# Patient Record
Sex: Male | Born: 1965 | Race: White | Hispanic: No | Marital: Married | Smoking: Never smoker
Health system: Southern US, Community
[De-identification: ages and names within clinical notes are randomized; demographics above are authoritative.]

## PROBLEM LIST (undated history)

## (undated) DIAGNOSIS — I10 Essential (primary) hypertension: Secondary | ICD-10-CM

## (undated) DIAGNOSIS — M62838 Other muscle spasm: Secondary | ICD-10-CM

## (undated) DIAGNOSIS — E785 Hyperlipidemia, unspecified: Secondary | ICD-10-CM

## (undated) DIAGNOSIS — K5790 Diverticulosis of intestine, part unspecified, without perforation or abscess without bleeding: Secondary | ICD-10-CM

## (undated) DIAGNOSIS — Z8669 Personal history of other diseases of the nervous system and sense organs: Secondary | ICD-10-CM

## (undated) DIAGNOSIS — F419 Anxiety disorder, unspecified: Secondary | ICD-10-CM

## (undated) DIAGNOSIS — Z9889 Other specified postprocedural states: Secondary | ICD-10-CM

## (undated) HISTORY — PX: COLONOSCOPY: SHX174

## (undated) HISTORY — PX: WISDOM TOOTH EXTRACTION: SHX21

## (undated) HISTORY — PX: HERNIA REPAIR: SHX51

---

## 2004-12-14 ENCOUNTER — Encounter: Admission: RE | Admit: 2004-12-14 | Discharge: 2004-12-14 | Payer: Self-pay | Admitting: Internal Medicine

## 2004-12-21 ENCOUNTER — Ambulatory Visit: Payer: Self-pay | Admitting: Gastroenterology

## 2005-01-17 ENCOUNTER — Ambulatory Visit: Payer: Self-pay | Admitting: Gastroenterology

## 2005-01-26 ENCOUNTER — Ambulatory Visit: Payer: Self-pay | Admitting: Gastroenterology

## 2006-06-19 ENCOUNTER — Encounter: Admission: RE | Admit: 2006-06-19 | Discharge: 2006-06-19 | Payer: Self-pay | Admitting: Internal Medicine

## 2010-03-06 ENCOUNTER — Encounter: Payer: Self-pay | Admitting: Internal Medicine

## 2010-11-23 ENCOUNTER — Other Ambulatory Visit (HOSPITAL_COMMUNITY): Payer: Self-pay | Admitting: Internal Medicine

## 2010-11-23 DIAGNOSIS — R569 Unspecified convulsions: Secondary | ICD-10-CM

## 2010-12-01 ENCOUNTER — Inpatient Hospital Stay (HOSPITAL_COMMUNITY): Admission: RE | Admit: 2010-12-01 | Payer: Self-pay | Source: Ambulatory Visit

## 2010-12-01 ENCOUNTER — Ambulatory Visit (HOSPITAL_COMMUNITY): Payer: Self-pay

## 2013-09-09 ENCOUNTER — Encounter: Payer: Self-pay | Admitting: Gastroenterology

## 2014-06-05 ENCOUNTER — Encounter: Payer: Self-pay | Admitting: Nurse Practitioner

## 2014-06-17 ENCOUNTER — Ambulatory Visit: Payer: Self-pay | Admitting: Nurse Practitioner

## 2015-02-19 ENCOUNTER — Encounter: Payer: Self-pay | Admitting: Gastroenterology

## 2015-06-15 ENCOUNTER — Ambulatory Visit (INDEPENDENT_AMBULATORY_CARE_PROVIDER_SITE_OTHER): Payer: Managed Care, Other (non HMO)

## 2015-06-15 ENCOUNTER — Encounter (HOSPITAL_COMMUNITY): Payer: Self-pay | Admitting: *Deleted

## 2015-06-15 ENCOUNTER — Ambulatory Visit (HOSPITAL_COMMUNITY)
Admission: EM | Admit: 2015-06-15 | Discharge: 2015-06-15 | Disposition: A | Discharge status: Home / Self Care | Payer: Managed Care, Other (non HMO) | Attending: Family Medicine | Admitting: Family Medicine

## 2015-06-15 DIAGNOSIS — S52501A Unspecified fracture of the lower end of right radius, initial encounter for closed fracture: Secondary | ICD-10-CM

## 2015-06-15 HISTORY — DX: Essential (primary) hypertension: I10

## 2015-06-15 MED ORDER — HYDROCODONE-ACETAMINOPHEN 7.5-325 MG PO TABS: 1.0000 | ORAL_TABLET | Freq: Four times a day (QID) | ORAL | Status: DC | PRN

## 2015-06-15 NOTE — Discharge Instructions (Signed)
Call in am to see orthopedist on tues for discussion about wrist injury

## 2015-06-15 NOTE — ED Provider Notes (Signed)
CSN: [id number]     Arrival date & time 06/15/15  1831 History   First MD Initiated Contact with Patient 06/15/15 1936     Chief Complaint  Patient presents with  . Wrist Injury   (Consider location/radiation/quality/duration/timing/severity/associated sxs/prior Treatment) Patient is a 50 y.o. male presenting with wrist injury. The history is provided by the patient.  Wrist Injury Location:  Wrist Time since incident:  2 hours Injury: yes   Mechanism of injury: fall   Mechanism of injury comment:  Fell from ladder trying to clean gutters Fall:    Fall occurred:  From a ladder   Impact surface:  Grass   Entrapped after fall: no   Wrist location:  R wrist Pain details:    Radiates to:  Does not radiate   Severity:  Moderate   Onset quality:  Sudden   Progression:  Unchanged Chronicity:  New Dislocation: no   Prior injury to area:  No Relieved by:  None tried Worsened by:  Nothing tried Ineffective treatments:  None tried Associated symptoms: decreased range of motion and swelling   Associated symptoms: no numbness     Past Medical History  Diagnosis Date  . Hypertension    History reviewed. No pertinent past surgical history. History reviewed. No pertinent family history. Social History  Substance Use Topics  . Smoking status: Never Smoker   . Smokeless tobacco: None  . Alcohol Use: No    Review of Systems  Constitutional: Negative.   Musculoskeletal: Positive for joint swelling.  Skin: Negative.   All other systems reviewed and are negative.   Allergies  Review of patient's allergies indicates no known allergies.  Home Medications   Prior to Admission medications   Medication Sig Start Date End Date Taking? Authorizing Provider  HYDROcodone-acetaminophen (NORCO) 7.5-325 MG tablet Take 1 tablet by mouth every 6 (six) hours as needed for moderate pain. 06/15/15   Linna HoffJames D Sammantha Mehlhaff, MD   Meds Ordered and Administered this Visit  Medications - No data to  display  BP 137/94 mmHg  Pulse 84  Temp(Src) 98.9 F (37.2 C) (Oral)  Resp 16  SpO2 97% No data found.   Physical Exam  Constitutional: He is oriented to person, place, and time. He appears well-developed and well-nourished. No distress.  Musculoskeletal: He exhibits tenderness.       Right wrist: He exhibits decreased range of motion, tenderness, bony tenderness, swelling and deformity. He exhibits no laceration.  Neurological: He is alert and oriented to person, place, and time.  Skin: Skin is warm and dry.  Nursing note and vitals reviewed.   ED Course  Procedures (including critical care time)  Labs Review Labs Reviewed - No data to display  Imaging Review Dg Wrist Complete Right  06/15/2015  CLINICAL DATA:  Right wrist pain after falling from a ladder at the top of a house. EXAM: RIGHT WRIST - COMPLETE 3+ VIEW COMPARISON:  None. FINDINGS: Comminuted fracture of the distal radial metaphysis and epiphysis, extending into the radiocarpal joint. Mild ulnar displacement of an ulnar fragment. Mild dorsal angulation of the distal fragments. There is also a fracture of the base of the ulnar styloid with mild radial displacement of the distal fragment. There is also an essentially nondisplaced dorsal carpal fracture with overlying soft tissue swelling. IMPRESSION: 1. Comminuted distal radial metaphysis and epiphysis fracture with extension into the radiocarpal joint. 2. Ulnar styloid fracture. 3. Dorsal carpal fracture. Electronically Signed   By: Zada FindersSteven  Reid M.D.  On: 06/15/2015 19:51   X-rays reviewed and report per radiologist.    Visual Acuity Review  Right Eye Distance:   Left Eye Distance:   Bilateral Distance:    Right Eye Near:   Left Eye Near:    Bilateral Near:         MDM   1. Distal radius fracture, right, closed, initial encounter     Discussed with dr Mina Marble, plans as suggested   Linna Hoff, MD 06/15/15 2028

## 2015-06-15 NOTE — ED Notes (Signed)
Short   Arm  splint applied  By  Milon Dikesrtho  Tech  roy

## 2015-06-15 NOTE — ED Notes (Signed)
Pt reports   He  Fell  Off  A  Ladder     And  Injured    His    r  Wrist  He  Has  Pain  And  Swelling  To  The  Affected  Wrist         He      Has  A  Contusion to  r  Side  Of  Head

## 2015-06-15 NOTE — Progress Notes (Signed)
Orthopedic Tech Progress Note Patient Details:  Lynnell DikeBryan S Campoli 11/10/1965 [id number] Applied fiberglass sugar tong splint to RUE.  Pulses, sensation, motion intact before and after splinting.  Capillary refill less than 2 seconds before and after splinting.  Placed splinted RUE in arm sling. Ortho Devices Type of Ortho Device: Sugartong splint, Arm sling Ortho Device/Splint Location: RUE Ortho Device/Splint Interventions: Application   Lesle ChrisGilliland, Hershel Corkery L 06/15/2015, 10:01 PM

## 2015-06-18 ENCOUNTER — Ambulatory Visit
Admission: RE | Admit: 2015-06-18 | Discharge: 2015-06-18 | Disposition: A | Discharge status: Home / Self Care | Payer: Managed Care, Other (non HMO) | Source: Ambulatory Visit | Attending: Orthopedic Surgery | Admitting: Orthopedic Surgery

## 2015-06-18 ENCOUNTER — Other Ambulatory Visit: Payer: Self-pay | Admitting: Orthopedic Surgery

## 2015-06-18 DIAGNOSIS — S52601A Unspecified fracture of lower end of right ulna, initial encounter for closed fracture: Principal | ICD-10-CM

## 2015-06-18 DIAGNOSIS — S52501A Unspecified fracture of the lower end of right radius, initial encounter for closed fracture: Secondary | ICD-10-CM

## 2015-06-19 ENCOUNTER — Encounter (HOSPITAL_COMMUNITY): Payer: Self-pay | Admitting: *Deleted

## 2015-06-20 ENCOUNTER — Encounter (HOSPITAL_COMMUNITY): Payer: Self-pay | Admitting: *Deleted

## 2015-06-20 ENCOUNTER — Ambulatory Visit (HOSPITAL_COMMUNITY): Payer: Managed Care, Other (non HMO) | Admitting: Anesthesiology

## 2015-06-20 ENCOUNTER — Observation Stay (HOSPITAL_COMMUNITY)
Admission: RE | Admit: 2015-06-20 | Discharge: 2015-06-21 | Disposition: A | Discharge status: Home / Self Care | Payer: Managed Care, Other (non HMO) | Source: Ambulatory Visit | Attending: Orthopedic Surgery | Admitting: Orthopedic Surgery

## 2015-06-20 ENCOUNTER — Encounter (HOSPITAL_COMMUNITY)
Admission: RE | Disposition: A | Discharge status: Home / Self Care | Payer: Self-pay | Source: Ambulatory Visit | Attending: Orthopedic Surgery

## 2015-06-20 DIAGNOSIS — Z79899 Other long term (current) drug therapy: Secondary | ICD-10-CM | POA: Insufficient documentation

## 2015-06-20 DIAGNOSIS — W11XXXA Fall on and from ladder, initial encounter: Secondary | ICD-10-CM | POA: Diagnosis not present

## 2015-06-20 DIAGNOSIS — S52501A Unspecified fracture of the lower end of right radius, initial encounter for closed fracture: Secondary | ICD-10-CM | POA: Diagnosis present

## 2015-06-20 DIAGNOSIS — S52611A Displaced fracture of right ulna styloid process, initial encounter for closed fracture: Secondary | ICD-10-CM | POA: Diagnosis not present

## 2015-06-20 DIAGNOSIS — E785 Hyperlipidemia, unspecified: Secondary | ICD-10-CM | POA: Diagnosis not present

## 2015-06-20 DIAGNOSIS — W132XXA Fall from, out of or through roof, initial encounter: Secondary | ICD-10-CM | POA: Diagnosis not present

## 2015-06-20 DIAGNOSIS — R51 Headache: Secondary | ICD-10-CM | POA: Insufficient documentation

## 2015-06-20 DIAGNOSIS — I10 Essential (primary) hypertension: Secondary | ICD-10-CM | POA: Diagnosis not present

## 2015-06-20 DIAGNOSIS — F419 Anxiety disorder, unspecified: Secondary | ICD-10-CM | POA: Diagnosis not present

## 2015-06-20 DIAGNOSIS — S62116A Nondisplaced fracture of triquetrum [cuneiform] bone, unspecified wrist, initial encounter for closed fracture: Principal | ICD-10-CM | POA: Insufficient documentation

## 2015-06-20 DIAGNOSIS — E7800 Pure hypercholesterolemia, unspecified: Secondary | ICD-10-CM | POA: Insufficient documentation

## 2015-06-20 HISTORY — DX: Diverticulosis of intestine, part unspecified, without perforation or abscess without bleeding: K57.90

## 2015-06-20 HISTORY — DX: Other muscle spasm: M62.838

## 2015-06-20 HISTORY — DX: Other specified postprocedural states: Z98.890

## 2015-06-20 HISTORY — DX: Hyperlipidemia, unspecified: E78.5

## 2015-06-20 HISTORY — DX: Personal history of other diseases of the nervous system and sense organs: Z86.69

## 2015-06-20 HISTORY — PX: OPEN REDUCTION INTERNAL FIXATION (ORIF) DISTAL RADIAL FRACTURE: SHX5989

## 2015-06-20 HISTORY — DX: Anxiety disorder, unspecified: F41.9

## 2015-06-20 LAB — BASIC METABOLIC PANEL
ANION GAP: 11 (ref 5–15)
BUN: 16 mg/dL (ref 6–20)
CO2: 22 mmol/L (ref 22–32)
Calcium: 9 mg/dL (ref 8.9–10.3)
Chloride: 104 mmol/L (ref 101–111)
Creatinine, Ser: 0.94 mg/dL (ref 0.61–1.24)
Glucose, Bld: 113 mg/dL — ABNORMAL HIGH (ref 65–99)
POTASSIUM: 4.1 mmol/L (ref 3.5–5.1)
SODIUM: 137 mmol/L (ref 135–145)

## 2015-06-20 LAB — CBC
HCT: 39.9 % (ref 39.0–52.0)
Hemoglobin: 13.3 g/dL (ref 13.0–17.0)
MCH: 29.9 pg (ref 26.0–34.0)
MCHC: 33.3 g/dL (ref 30.0–36.0)
MCV: 89.7 fL (ref 78.0–100.0)
PLATELETS: 307 10*3/uL (ref 150–400)
RBC: 4.45 MIL/uL (ref 4.22–5.81)
RDW: 13.3 % (ref 11.5–15.5)
WBC: 6.1 10*3/uL (ref 4.0–10.5)

## 2015-06-20 SURGERY — OPEN REDUCTION INTERNAL FIXATION (ORIF) DISTAL RADIUS FRACTURE
Anesthesia: Monitor Anesthesia Care | Site: Wrist | Laterality: Right

## 2015-06-20 MED ORDER — MIDAZOLAM HCL 2 MG/2ML IJ SOLN
INTRAMUSCULAR | Status: AC
  Filled 2015-06-20: qty 2

## 2015-06-20 MED ORDER — LACTATED RINGERS IV SOLN
INTRAVENOUS | Status: DC | PRN
  Administered 2015-06-20: 12:00:00 via INTRAVENOUS

## 2015-06-20 MED ORDER — BUPIVACAINE-EPINEPHRINE (PF) 0.5% -1:200000 IJ SOLN
INTRAMUSCULAR | Status: DC | PRN
  Administered 2015-06-20: 30 mL via PERINEURAL

## 2015-06-20 MED ORDER — DEXMEDETOMIDINE HCL IN NACL 200 MCG/50ML IV SOLN
INTRAVENOUS | Status: AC
  Filled 2015-06-20: qty 50

## 2015-06-20 MED ORDER — KETAMINE HCL 10 MG/ML IJ SOLN
INTRAMUSCULAR | Status: DC | PRN
  Administered 2015-06-20 (×4): 10 mg via INTRAVENOUS

## 2015-06-20 MED ORDER — PROPOFOL 500 MG/50ML IV EMUL
INTRAVENOUS | Status: DC | PRN
  Administered 2015-06-20: 50 ug/kg/min via INTRAVENOUS

## 2015-06-20 MED ORDER — ONDANSETRON HCL 4 MG/2ML IJ SOLN
4.0000 mg | Freq: Four times a day (QID) | INTRAMUSCULAR | Status: DC | PRN
  Filled 2015-06-20: qty 2

## 2015-06-20 MED ORDER — CEFAZOLIN SODIUM-DEXTROSE 2-4 GM/100ML-% IV SOLN
INTRAVENOUS | Status: AC
  Administered 2015-06-20: 2 g via INTRAVENOUS
  Filled 2015-06-20: qty 100

## 2015-06-20 MED ORDER — ATORVASTATIN CALCIUM 10 MG PO TABS
10.0000 mg | ORAL_TABLET | Freq: Every day | ORAL | Status: DC
  Administered 2015-06-21: 10 mg via ORAL
  Filled 2015-06-20: qty 1

## 2015-06-20 MED ORDER — SUMATRIPTAN SUCCINATE 50 MG PO TABS
50.0000 mg | ORAL_TABLET | ORAL | Status: DC | PRN
  Filled 2015-06-20: qty 1

## 2015-06-20 MED ORDER — LIDOCAINE 2% (20 MG/ML) 5 ML SYRINGE
INTRAMUSCULAR | Status: AC
  Filled 2015-06-20: qty 5

## 2015-06-20 MED ORDER — PROPOFOL 10 MG/ML IV BOLUS
INTRAVENOUS | Status: AC
Start: 2015-06-20 — End: 2015-06-20
  Filled 2015-06-20: qty 40

## 2015-06-20 MED ORDER — METHOCARBAMOL 1000 MG/10ML IJ SOLN
500.0000 mg | Freq: Four times a day (QID) | INTRAMUSCULAR | Status: DC | PRN
  Filled 2015-06-20: qty 5

## 2015-06-20 MED ORDER — MORPHINE SULFATE (PF) 2 MG/ML IV SOLN
1.0000 mg | INTRAVENOUS | Status: DC | PRN
  Administered 2015-06-20 – 2015-06-21 (×6): 1 mg via INTRAVENOUS
  Filled 2015-06-20 (×6): qty 1

## 2015-06-20 MED ORDER — PROMETHAZINE HCL 12.5 MG RE SUPP
12.5000 mg | Freq: Four times a day (QID) | RECTAL | Status: DC | PRN
  Filled 2015-06-20: qty 1

## 2015-06-20 MED ORDER — CEFAZOLIN SODIUM 1-5 GM-% IV SOLN
1.0000 g | Freq: Three times a day (TID) | INTRAVENOUS | Status: DC
  Administered 2015-06-20 – 2015-06-21 (×2): 1 g via INTRAVENOUS
  Filled 2015-06-20 (×4): qty 50

## 2015-06-20 MED ORDER — SENNA 8.6 MG PO TABS
1.0000 | ORAL_TABLET | Freq: Two times a day (BID) | ORAL | Status: DC
Start: 2015-06-20 — End: 2015-06-21
  Administered 2015-06-20 – 2015-06-21 (×2): 8.6 mg via ORAL
  Filled 2015-06-20 (×2): qty 1

## 2015-06-20 MED ORDER — LIDOCAINE HCL (CARDIAC) 20 MG/ML IV SOLN
INTRAVENOUS | Status: DC | PRN
  Administered 2015-06-20: 20 mg via INTRAVENOUS

## 2015-06-20 MED ORDER — SODIUM CHLORIDE 0.9 % IJ SOLN
INTRAMUSCULAR | Status: AC
  Filled 2015-06-20: qty 10

## 2015-06-20 MED ORDER — CHLORHEXIDINE GLUCONATE 4 % EX LIQD: 60.0000 mL | Freq: Once | CUTANEOUS | Status: DC

## 2015-06-20 MED ORDER — CEFAZOLIN SODIUM-DEXTROSE 2-4 GM/100ML-% IV SOLN: 2.0000 g | INTRAVENOUS | Status: DC

## 2015-06-20 MED ORDER — KETAMINE HCL 100 MG/ML IJ SOLN
INTRAMUSCULAR | Status: AC
  Filled 2015-06-20: qty 1

## 2015-06-20 MED ORDER — OXYCODONE-ACETAMINOPHEN 5-325 MG PO TABS
1.0000 | ORAL_TABLET | ORAL | Status: DC | PRN
  Administered 2015-06-20 – 2015-06-21 (×4): 2 via ORAL
  Filled 2015-06-20 (×5): qty 2

## 2015-06-20 MED ORDER — METHOCARBAMOL 500 MG PO TABS
500.0000 mg | ORAL_TABLET | Freq: Four times a day (QID) | ORAL | Status: DC | PRN
  Administered 2015-06-21 (×2): 500 mg via ORAL
  Filled 2015-06-20 (×2): qty 1

## 2015-06-20 MED ORDER — MIDAZOLAM HCL 5 MG/5ML IJ SOLN
INTRAMUSCULAR | Status: DC | PRN
  Administered 2015-06-20: 2 mg via INTRAVENOUS
  Administered 2015-06-20 (×2): 1 mg via INTRAVENOUS

## 2015-06-20 MED ORDER — LACTATED RINGERS IV SOLN
INTRAVENOUS | Status: DC
  Administered 2015-06-20: 20:00:00 via INTRAVENOUS

## 2015-06-20 MED ORDER — CEFAZOLIN SODIUM 1-5 GM-% IV SOLN
1.0000 g | INTRAVENOUS | Status: AC
  Administered 2015-06-20: 1 g via INTRAVENOUS

## 2015-06-20 MED ORDER — PROPOFOL 10 MG/ML IV BOLUS
INTRAVENOUS | Status: DC | PRN
  Administered 2015-06-20: 20 mg via INTRAVENOUS

## 2015-06-20 MED ORDER — ATENOLOL 50 MG PO TABS
50.0000 mg | ORAL_TABLET | Freq: Every day | ORAL | Status: DC
  Administered 2015-06-21: 50 mg via ORAL
  Filled 2015-06-20: qty 1

## 2015-06-20 MED ORDER — CEFAZOLIN SODIUM 1-5 GM-% IV SOLN
INTRAVENOUS | Status: AC
  Administered 2015-06-20: 1 g via INTRAVENOUS
  Filled 2015-06-20: qty 50

## 2015-06-20 MED ORDER — FENTANYL CITRATE (PF) 250 MCG/5ML IJ SOLN
INTRAMUSCULAR | Status: AC
  Filled 2015-06-20: qty 5

## 2015-06-20 MED ORDER — 0.9 % SODIUM CHLORIDE (POUR BTL) OPTIME
TOPICAL | Status: DC | PRN
  Administered 2015-06-20: 1000 mL

## 2015-06-20 MED ORDER — ALPRAZOLAM 0.5 MG PO TABS
0.5000 mg | ORAL_TABLET | Freq: Four times a day (QID) | ORAL | Status: DC | PRN
  Administered 2015-06-20 – 2015-06-21 (×2): 0.5 mg via ORAL
  Filled 2015-06-20 (×2): qty 1

## 2015-06-20 MED ORDER — PANTOPRAZOLE SODIUM 40 MG PO TBEC: 40.0000 mg | DELAYED_RELEASE_TABLET | Freq: Two times a day (BID) | ORAL | Status: DC | PRN

## 2015-06-20 MED ORDER — LACTATED RINGERS IV SOLN
INTRAVENOUS | Status: DC
  Administered 2015-06-20: 10:00:00 via INTRAVENOUS

## 2015-06-20 MED ORDER — ONDANSETRON HCL 4 MG PO TABS: 4.0000 mg | ORAL_TABLET | Freq: Four times a day (QID) | ORAL | Status: DC | PRN

## 2015-06-20 MED ORDER — FENTANYL CITRATE (PF) 100 MCG/2ML IJ SOLN
INTRAMUSCULAR | Status: AC
  Administered 2015-06-20: 100 ug via INTRAVENOUS
  Filled 2015-06-20: qty 2

## 2015-06-20 SURGICAL SUPPLY — 62 items
BANDAGE ACE 3X5.8 VEL STRL LF (GAUZE/BANDAGES/DRESSINGS) ×1 IMPLANT
BANDAGE ELASTIC 3 VELCRO ST LF (GAUZE/BANDAGES/DRESSINGS) ×2 IMPLANT
BANDAGE ELASTIC 4 VELCRO ST LF (GAUZE/BANDAGES/DRESSINGS) ×1 IMPLANT
BIT DRILL 2.2 SS TIBIAL (BIT) ×1 IMPLANT
BLADE SURG ROTATE 9660 (MISCELLANEOUS) IMPLANT
BNDG CMPR 9X4 STRL LF SNTH (GAUZE/BANDAGES/DRESSINGS)
BNDG ESMARK 4X9 LF (GAUZE/BANDAGES/DRESSINGS) ×1 IMPLANT
BNDG GAUZE ELAST 4 BULKY (GAUZE/BANDAGES/DRESSINGS) ×4 IMPLANT
CORDS BIPOLAR (ELECTRODE) ×2 IMPLANT
COVER SURGICAL LIGHT HANDLE (MISCELLANEOUS) ×2 IMPLANT
CUFF TOURNIQUET SINGLE 18IN (TOURNIQUET CUFF) ×2 IMPLANT
CUFF TOURNIQUET SINGLE 24IN (TOURNIQUET CUFF) IMPLANT
DECANTER SPIKE VIAL GLASS SM (MISCELLANEOUS) IMPLANT
DRAIN TLS ROUND 10FR (DRAIN) IMPLANT
DRAPE OEC MINIVIEW 54X84 (DRAPES) ×1 IMPLANT
DRAPE U-SHAPE 47X51 STRL (DRAPES) ×2 IMPLANT
DRSG ADAPTIC 3X8 NADH LF (GAUZE/BANDAGES/DRESSINGS) ×2 IMPLANT
GAUZE SPONGE 4X4 12PLY STRL (GAUZE/BANDAGES/DRESSINGS) ×1 IMPLANT
GAUZE XEROFORM 5X9 LF (GAUZE/BANDAGES/DRESSINGS) ×2 IMPLANT
GLOVE BIOGEL M 8.0 STRL (GLOVE) ×3 IMPLANT
GLOVE SS BIOGEL STRL SZ 8 (GLOVE) ×1 IMPLANT
GLOVE SUPERSENSE BIOGEL SZ 8 (GLOVE) ×1
GOWN STRL REUS W/ TWL LRG LVL3 (GOWN DISPOSABLE) ×3 IMPLANT
GOWN STRL REUS W/ TWL XL LVL3 (GOWN DISPOSABLE) ×3 IMPLANT
GOWN STRL REUS W/TWL LRG LVL3 (GOWN DISPOSABLE) ×4
GOWN STRL REUS W/TWL XL LVL3 (GOWN DISPOSABLE) ×2
KIT BASIN OR (CUSTOM PROCEDURE TRAY) ×2 IMPLANT
KIT ROOM TURNOVER OR (KITS) ×2 IMPLANT
LOOP VESSEL MAXI BLUE (MISCELLANEOUS) IMPLANT
MANIFOLD NEPTUNE II (INSTRUMENTS) ×1 IMPLANT
NEEDLE 22X1 1/2 (OR ONLY) (NEEDLE) IMPLANT
NS IRRIG 1000ML POUR BTL (IV SOLUTION) ×2 IMPLANT
PACK ORTHO EXTREMITY (CUSTOM PROCEDURE TRAY) ×2 IMPLANT
PAD ARMBOARD 7.5X6 YLW CONV (MISCELLANEOUS) ×4 IMPLANT
PAD CAST 4YDX4 CTTN HI CHSV (CAST SUPPLIES) ×1 IMPLANT
PADDING CAST COTTON 4X4 STRL (CAST SUPPLIES) ×2
PEG LOCKING SMOOTH 2.2X20 (Screw) ×3 IMPLANT
PEG LOCKING SMOOTH 2.2X22 (Screw) ×2 IMPLANT
PEG LOCKING SMOOTH 2.2X24 (Peg) ×1 IMPLANT
PLATE RIGHT VOLAR RIM DVR (Plate) ×1 IMPLANT
SCREW LOCK 16X2.7X 3 LD TPR (Screw) IMPLANT
SCREW LOCK 18X2.7X 3 LD TPR (Screw) IMPLANT
SCREW LOCK 22X2.7X 3 LD TPR (Screw) IMPLANT
SCREW LOCKING 2.7X15MM (Screw) ×2 IMPLANT
SCREW LOCKING 2.7X16 (Screw) ×4 IMPLANT
SCREW LOCKING 2.7X18 (Screw) ×2 IMPLANT
SCREW LOCKING 2.7X22MM (Screw) ×2 IMPLANT
SPLINT FIBERGLASS 4X30 (CAST SUPPLIES) ×1 IMPLANT
SPONGE GAUZE 4X4 12PLY STER LF (GAUZE/BANDAGES/DRESSINGS) ×1 IMPLANT
SPONGE LAP 4X18 X RAY DECT (DISPOSABLE) IMPLANT
SUT MNCRL AB 4-0 PS2 18 (SUTURE) ×2 IMPLANT
SUT PROLENE 3 0 PS 2 (SUTURE) IMPLANT
SUT PROLENE 4 0 PS 2 18 (SUTURE) ×2 IMPLANT
SUT VIC AB 3-0 FS2 27 (SUTURE) ×1 IMPLANT
SYR CONTROL 10ML LL (SYRINGE) IMPLANT
SYSTEM CHEST DRAIN TLS 7FR (DRAIN) ×1 IMPLANT
TOWEL OR 17X24 6PK STRL BLUE (TOWEL DISPOSABLE) ×2 IMPLANT
TOWEL OR 17X26 10 PK STRL BLUE (TOWEL DISPOSABLE) ×2 IMPLANT
TUBE CONNECTING 12X1/4 (SUCTIONS) ×2 IMPLANT
TUBE EVACUATION TLS (MISCELLANEOUS) ×1 IMPLANT
UNDERPAD 30X30 INCONTINENT (UNDERPADS AND DIAPERS) ×2 IMPLANT
WATER STERILE IRR 1000ML POUR (IV SOLUTION) ×2 IMPLANT

## 2015-06-20 NOTE — Op Note (Signed)
See dictation #[id number] Amanda PeaGramig MD

## 2015-06-20 NOTE — Anesthesia Postprocedure Evaluation (Signed)
Anesthesia Post Note  Patient: Gregory Walsh  Procedure(s) Performed: Procedure(s) (LRB): OPEN REDUCTION INTERNAL FIXATION (ORIF RIGHT ) DISTAL RADIAL FRACTURE WITH REPAIRS  (Right)  Patient location during evaluation: PACU Anesthesia Type: MAC and Regional Level of consciousness: awake and alert Pain management: pain level controlled Vital Signs Assessment: post-procedure vital signs reviewed and stable Respiratory status: spontaneous breathing Cardiovascular status: stable Anesthetic complications: no    Last Vitals:  Filed Vitals:   06/20/15 1415 06/20/15 1430  BP:    Pulse: 53 56  Temp:    Resp: 15 11    Last Pain:  Filed Vitals:   06/20/15 1435  PainSc: 0-No pain                 Lewie LoronJohn Jaaziah Schulke

## 2015-06-20 NOTE — H&P (Signed)
Gregory Walsh is an 50 y.o. male.   Chief Complaint: Right radius fracture HPI: The patient is a pleasant 50 year old male presents for operative intervention in regards to his right wrist. The patient fell from a ladder resulting in a comminuted displaced interarticular distal radius fracture. The patient was seen and evaluated in our office setting. We discussed with the patient proceeding ahead with open reduction internal fixation of the right distal radius fracture. Risk and benefits were discussed at length. The patient desires to proceed.  Past Medical History  Diagnosis Date  . Hypertension     takes Atenolol daily  . History of migraine     takes Maxalt daily as needed  . Hyperlipidemia     takes Atorvastatin daily  . Anxiety     takes Xanax as needed  . Muscle spasm     takes Flexeril daily as needed  . History of colonoscopy   . Diverticulosis     Past Surgical History  Procedure Laterality Date  . Hernia repair  80's  . Wisdom tooth extraction    . Colonoscopy      History reviewed. No pertinent family history. Social History:  reports that he has never smoked. He does not have any smokeless tobacco history on file. He reports that he does not drink alcohol. His drug history is not on file.  Allergies: No Known Allergies  Medications Prior to Admission  Medication Sig Dispense Refill  . Acetaminophen-Caffeine (EXCEDRIN TENSION HEADACHE) 500-65 MG TABS Take 1 tablet by mouth 2 (two) times daily as needed (for headache).     . ALPRAZolam (XANAX) 1 MG tablet Take 1 mg by mouth daily as needed for anxiety or sleep.   2  . atenolol (TENORMIN) 50 MG tablet Take 50 mg by mouth daily.  2  . atorvastatin (LIPITOR) 10 MG tablet Take 10 mg by mouth daily.  6  . cyclobenzaprine (FLEXERIL) 10 MG tablet Take 10 mg by mouth daily as needed for muscle spasms.   1  . Multiple Vitamin (MULTIVITAMIN) tablet Take 1 tablet by mouth daily.    Marland Kitchen. oxyCODONE (ROXICODONE) 5 MG immediate  release tablet Take 5 mg by mouth every 4 (four) hours as needed for moderate pain or severe pain.    . promethazine (PHENERGAN) 25 MG tablet Take 1 tablet by mouth every 8 (eight) hours as needed for nausea or vomiting.   3  . rizatriptan (MAXALT) 10 MG tablet Take 10 mg by mouth as needed for migraine (May repeat in 2 hours, if needed.).       No results found for this or any previous visit (from the past 48 hour(s)). Ct Wrist Right Wo Contrast  06/18/2015  CLINICAL DATA:  Larey SeatFell from a roof 3 days ago. Evaluate distal radius fracture. EXAM: CT OF THE RIGHT WRIST WITHOUT CONTRAST TECHNIQUE: Multidetector CT imaging of the right wrist was performed according to the standard protocol. Multiplanar CT image reconstructions were also generated. COMPARISON:  Radiographs 06/15/2015. FINDINGS: There is a comminuted intra-articular fracture of the distal radius. This is a T-shaped fracture with the longitudinal fracture splitting the articular surface of the radius in half. There is also a dorsally impacted and comminuted component of the fracture but no significant depression. Small scattered fracture fragments are noted in the joint. Please see 3D images. There is an ulnar styloid avulsion fracture noted. There is also an avulsion fracture off the dorsal aspect of the triquetrum. The other carpal bones are intact. IMPRESSION:  1. Complex comminuted intra-articular fracture distal radius as discussed above. 2. Ulnar styloid avulsion fracture. 3. Dorsal triquetrum avulsion fracture. Electronically Signed   By: Rudie Meyer M.D.   On: 06/18/2015 16:25    Review of Systems  Constitutional: Negative.   HENT: Negative.   Eyes: Negative.   Cardiovascular: Negative.   Gastrointestinal: Negative.   Musculoskeletal:       See HPI  Skin: Negative.   Psychiatric/Behavioral: The patient is nervous/anxious.     Height 6' (1.829 m), weight 90.719 kg (200 lb). Physical Exam  The patient is alert and oriented in no  acute distress. The patient complains of pain in the affected upper extremity.  The patient is noted to have a normal HEENT exam. Lung fields show equal chest expansion and no shortness of breath. Abdomen exam is nontender without distention. Lower extremity examination does not show any fracture dislocation or blood clot symptoms. Pelvis is stable and the neck and back are stable and nontender. Rue: splint clean and dry, digital range of motion is intact, refill is intact  Assessment/Plan Status post right distal radius fracture History of anxiety History of hypercholesterolemia History of hypertension History of chronic headaches We are planning surgery for your upper extremity. The risk and benefits of surgery to include risk of bleeding, infection, anesthesia,  damage to normal structures and failure of the surgery to accomplish its intended goals of relieving symptoms and restoring function have been discussed in detail. With this in mind we plan to proceed. I have specifically discussed with the patient the pre-and postoperative regime and the dos and don'ts and risk and benefits in great detail. Risk and benefits of surgery also include risk of dystrophy(CRPS), chronic nerve pain, failure of the healing process to go onto completion and other inherent risks of surgery The relavent the pathophysiology of the disease/injury process, as well as the alternatives for treatment and postoperative course of action has been discussed in great detail with the patient who desires to proceed.  We will do everything in our power to help you (the patient) restore function to the upper extremity. It is a pleasure to see this patient today.   Ilham Roughton L, PA-C 06/20/2015, 9:51 AM

## 2015-06-20 NOTE — Transfer of Care (Signed)
Immediate Anesthesia Transfer of Care Note  Patient: Gregory Walsh  Procedure(s) Performed: Procedure(s): OPEN REDUCTION INTERNAL FIXATION (ORIF RIGHT ) DISTAL RADIAL FRACTURE WITH REPAIRS  (Right)  Patient Location: PACU  Anesthesia Type:MAC combined with regional for post-op pain  Level of Consciousness: awake, oriented and patient cooperative  Airway & Oxygen Therapy: Patient Spontanous Breathing  Post-op Assessment: Report given to RN, Post -op Vital signs reviewed and stable and Patient moving all extremities  Post vital signs: Reviewed and stable  Last Vitals:  Filed Vitals:   06/20/15 1130 06/20/15 1145  BP:    Pulse: 69 67  Temp:    Resp: 12 12    Last Pain:  Filed Vitals:   06/20/15 1154  PainSc: 7       Patients Stated Pain Goal: 3 (06/19/15 1205)  Complications: No apparent anesthesia complications

## 2015-06-20 NOTE — Anesthesia Preprocedure Evaluation (Addendum)
Anesthesia Evaluation  Patient identified by MRN, date of birth, ID band Patient awake    Reviewed: Allergy & Precautions, NPO status , Patient's Chart, lab work & pertinent test results, reviewed documented beta blocker date and time   Airway Mallampati: II  TM Distance: >3 FB Neck ROM: Full    Dental  (+) Teeth Intact, Dental Advisory Given   Pulmonary neg pulmonary ROS,    Pulmonary exam normal breath sounds clear to auscultation       Cardiovascular hypertension, Pt. on medications and Pt. on home beta blockers Normal cardiovascular exam Rhythm:Regular Rate:Normal     Neuro/Psych PSYCHIATRIC DISORDERS Anxiety negative neurological ROS     GI/Hepatic negative GI ROS, Neg liver ROS,   Endo/Other  negative endocrine ROS  Renal/GU negative Renal ROS     Musculoskeletal negative musculoskeletal ROS (+)   Abdominal   Peds  Hematology negative hematology ROS (+)   Anesthesia Other Findings   Reproductive/Obstetrics                          Anesthesia Physical Anesthesia Plan  ASA: II  Anesthesia Plan: MAC and Regional   Post-op Pain Management:    Induction: Intravenous  Airway Management Planned:   Additional Equipment:   Intra-op Plan:   Post-operative Plan:   Informed Consent: I have reviewed the patients History and Physical, chart, labs and discussed the procedure including the risks, benefits and alternatives for the proposed anesthesia with the patient or authorized representative who has indicated his/her understanding and acceptance.   Dental advisory given  Plan Discussed with: CRNA  Anesthesia Plan Comments:         Anesthesia Quick Evaluation

## 2015-06-20 NOTE — Anesthesia Procedure Notes (Signed)
Anesthesia Regional Block:  Axillary brachial plexus block  Pre-Anesthetic Checklist: ,, timeout performed, Correct Patient, Correct Site, Correct Laterality, Correct Procedure, Correct Position, site marked, Risks and benefits discussed, Surgical consent,  Pre-op evaluation,  Post-op pain management  Laterality: Right  Prep: chloraprep       Needles:  Injection technique: Single-shot  Needle Type: Stimulator Needle - 40     Needle Length: 4cm 4 cm Needle Gauge: 22 and 22 G    Additional Needles:  Procedures: ultrasound guided (picture in chart) Axillary brachial plexus block Narrative:  Injection made incrementally with aspirations every 5 mL.  Performed by: Personally  Anesthesiologist: Kasandra Fehr  Additional Notes: BP cuff, EKG monitors applied. Sedation begun. Nerve location verified with U/S. Anesthetic injected incrementally, slowly , and after neg aspirations under direct u/s guidance. Good perineural spread. Tolerated well.    

## 2015-06-20 NOTE — Progress Notes (Signed)
Orthopedic Tech Progress Note Patient Details:  Gregory Walsh 10/11/1965 [id number] Applied arm sling to RUE. Ortho Devices Type of Ortho Device: Arm sling Ortho Device/Splint Location: RUE Ortho Device/Splint Interventions: Application   Lesle ChrisGilliland, Breaker Springer L 06/20/2015, 1:59 PM

## 2015-06-21 DIAGNOSIS — S62116A Nondisplaced fracture of triquetrum [cuneiform] bone, unspecified wrist, initial encounter for closed fracture: Secondary | ICD-10-CM | POA: Diagnosis not present

## 2015-06-21 MED ORDER — OXYCODONE-ACETAMINOPHEN 5-325 MG PO TABS: 1.0000 | ORAL_TABLET | ORAL | Status: DC | PRN

## 2015-06-21 NOTE — Progress Notes (Signed)
Discharge instructin gave to pt and all question answered. IV removed without complication.

## 2015-06-21 NOTE — Discharge Summary (Signed)
Physician Discharge Summary  Patient ID: Gregory Walsh MRN: [id number] DOB/AGE: December 10, 1965 50 y.o.  Admit date: 06/20/2015 Discharge date: 5/7  Admission Diagnoses: RIGHT DISTAL  RADIUS  ULNAR FRACTURE Past Medical History  Diagnosis Date  . Hypertension     takes Atenolol daily  . History of migraine     takes Maxalt daily as needed  . Hyperlipidemia     takes Atorvastatin daily  . Anxiety     takes Xanax as needed  . Muscle spasm     takes Flexeril daily as needed  . History of colonoscopy   . Diverticulosis     Discharge Diagnoses:  Active Problems:   Distal radius fracture, right   Surgeries: Procedure(s): OPEN REDUCTION INTERNAL FIXATION (ORIF RIGHT ) DISTAL RADIAL FRACTURE WITH REPAIRS  on 06/20/2015    Consultants:  none  Discharged Condition: Improved  Hospital Course: Gregory Walsh is an 50 y.o. male who was admitted 06/20/2015 with a chief complaint of No chief complaint on file. , and found to have a diagnosis of RIGHT DISTAL  RADIUS  ULNAR FRACTURE.  They were brought to the operating room on 06/20/2015 and underwent Procedure(s): OPEN REDUCTION INTERNAL FIXATION (ORIF RIGHT ) DISTAL RADIAL FRACTURE WITH REPAIRS .    They were given perioperative antibiotics: Anti-infectives    Start     Dose/Rate Route Frequency Ordered Stop   06/20/15 2200  ceFAZolin (ANCEF) IVPB 1 g/50 mL premix     1 g 100 mL/hr over 30 Minutes Intravenous Every 8 hours 06/20/15 1335     06/20/15 1345  ceFAZolin (ANCEF) IVPB 1 g/50 mL premix     1 g 100 mL/hr over 30 Minutes Intravenous NOW 06/20/15 1335 06/20/15 1427   06/20/15 0938  ceFAZolin (ANCEF) 2-4 GM/100ML-% IVPB    Comments:  Shireen Quan   : cabinet override      06/20/15 0938 06/20/15 1220   06/20/15 0936  ceFAZolin (ANCEF) IVPB 2g/100 mL premix  Status:  Discontinued     2 g 200 mL/hr over 30 Minutes Intravenous On call to O.R. 06/20/15 4098 06/20/15 1452    .  They were given sequential compression devices, early  ambulation.  Recent vital signs: Patient Vitals for the past 24 hrs:  BP Temp Temp src Pulse Resp SpO2  06/21/15 0540 (!) 147/81 mmHg 98.4 F (36.9 C) Oral 69 16 100 %  06/20/15 2005 127/78 mmHg 98.7 F (37.1 C) Oral 66 15 100 %  06/20/15 1500 (!) 134/94 mmHg 98.5 F (36.9 C) - (!) 59 13 100 %  06/20/15 1430 (!) 129/92 mmHg 98.4 F (36.9 C) - (!) 56 11 98 %  06/20/15 1415 (!) 132/91 mmHg - - (!) 53 15 100 %  06/20/15 1400 (!) 142/95 mmHg - - (!) 57 11 100 %  06/20/15 1345 (!) 134/95 mmHg - - (!) 54 15 100 %  06/20/15 1325 (!) 149/89 mmHg 97.9 F (36.6 C) - (!) 52 16 100 %  06/20/15 1145 - - - 67 12 99 %  06/20/15 1130 - - - 69 12 95 %  06/20/15 1115 - - - 72 14 100 %  06/20/15 1110 - - - 70 17 98 %  06/20/15 1109 - - - 69 13 98 %  06/20/15 1108 - - - 72 15 97 %  06/20/15 1107 - - - 67 13 98 %  06/20/15 1106 - - - 65 15 96 %  06/20/15 1105 - - - 73 11  98 %  06/20/15 1104 125/84 mmHg - - 63 12 95 %  06/20/15 1103 - - - 70 16 96 %  06/20/15 1102 - - - 73 14 96 %  06/20/15 1100 - - - 63 13 95 %  06/20/15 1056 - - - 75 14 97 %  06/20/15 1055 138/79 mmHg - - 68 12 100 %  06/20/15 1054 - - - 71 11 99 %  06/20/15 1053 - - - 67 16 98 %  06/20/15 1052 - - - 65 16 99 %  06/20/15 1051 - - - 66 17 99 %  06/20/15 1050 - - - 67 18 98 %  06/20/15 1049 136/79 mmHg - - 68 16 97 %  06/20/15 1048 - - - 67 14 97 %  06/20/15 1047 - - - 60 15 98 %  06/20/15 1046 - - - (!) 59 17 96 %  06/20/15 1045 - - - 63 17 95 %  06/20/15 1044 137/80 mmHg - - 72 11 96 %  06/20/15 1043 - - - (!) 59 13 96 %  06/20/15 1042 - - - (!) 54 15 98 %  06/20/15 1041 - - - (!) 54 13 97 %  06/20/15 1040 - - - (!) 57 14 98 %  06/20/15 1039 (!) 138/104 mmHg - - 65 12 98 %  06/20/15 1038 - - - 66 15 98 %  06/20/15 1001 140/80 mmHg 98.4 F (36.9 C) Oral 66 16 97 %  .  Recent laboratory studies: No results found.  Discharge Medications:     Medication List    STOP taking these medications        ROXICODONE 5 MG  immediate release tablet  Generic drug:  oxyCODONE      TAKE these medications        ALPRAZolam 1 MG tablet  Commonly known as:  XANAX  Take 1 mg by mouth daily as needed for anxiety or sleep.     atenolol 50 MG tablet  Commonly known as:  TENORMIN  Take 50 mg by mouth daily.     atorvastatin 10 MG tablet  Commonly known as:  LIPITOR  Take 10 mg by mouth daily.     cyclobenzaprine 10 MG tablet  Commonly known as:  FLEXERIL  Take 10 mg by mouth daily as needed for muscle spasms.     EXCEDRIN TENSION HEADACHE 500-65 MG Tabs  Generic drug:  Acetaminophen-Caffeine  Take 1 tablet by mouth 2 (two) times daily as needed (for headache).     multivitamin tablet  Take 1 tablet by mouth daily.     oxyCODONE-acetaminophen 5-325 MG tablet  Commonly known as:  PERCOCET/ROXICET  Take 1-2 tablets by mouth every 4 (four) hours as needed for moderate pain.     promethazine 25 MG tablet  Commonly known as:  PHENERGAN  Take 1 tablet by mouth every 8 (eight) hours as needed for nausea or vomiting.     rizatriptan 10 MG tablet  Commonly known as:  MAXALT  Take 10 mg by mouth as needed for migraine (May repeat in 2 hours, if needed.).        Diagnostic Studies: Dg Wrist Complete Right  06/15/2015  CLINICAL DATA:  Right wrist pain after falling from a ladder at the top of a house. EXAM: RIGHT WRIST - COMPLETE 3+ VIEW COMPARISON:  None. FINDINGS: Comminuted fracture of the distal radial metaphysis and epiphysis, extending into the radiocarpal joint. Mild ulnar displacement  of an ulnar fragment. Mild dorsal angulation of the distal fragments. There is also a fracture of the base of the ulnar styloid with mild radial displacement of the distal fragment. There is also an essentially nondisplaced dorsal carpal fracture with overlying soft tissue swelling. IMPRESSION: 1. Comminuted distal radial metaphysis and epiphysis fracture with extension into the radiocarpal joint. 2. Ulnar styloid fracture. 3.  Dorsal carpal fracture. Electronically Signed   By: Beckie Salts M.D.   On: 06/15/2015 19:51   Ct Wrist Right Wo Contrast  06/18/2015  CLINICAL DATA:  Larey Seat from a roof 3 days ago. Evaluate distal radius fracture. EXAM: CT OF THE RIGHT WRIST WITHOUT CONTRAST TECHNIQUE: Multidetector CT imaging of the right wrist was performed according to the standard protocol. Multiplanar CT image reconstructions were also generated. COMPARISON:  Radiographs 06/15/2015. FINDINGS: There is a comminuted intra-articular fracture of the distal radius. This is a T-shaped fracture with the longitudinal fracture splitting the articular surface of the radius in half. There is also a dorsally impacted and comminuted component of the fracture but no significant depression. Small scattered fracture fragments are noted in the joint. Please see 3D images. There is an ulnar styloid avulsion fracture noted. There is also an avulsion fracture off the dorsal aspect of the triquetrum. The other carpal bones are intact. IMPRESSION: 1. Complex comminuted intra-articular fracture distal radius as discussed above. 2. Ulnar styloid avulsion fracture. 3. Dorsal triquetrum avulsion fracture. Electronically Signed   By: Rudie Meyer M.D.   On: 06/18/2015 16:25    They benefited maximally from their hospital stay and there were no complications.     Disposition: 01-Home or Self Care     Discharge Instructions    Call MD / Call 911    Complete by:  As directed   If you experience chest pain or shortness of breath, CALL 911 and be transported to the hospital emergency room.  If you develope a fever above 101 F, pus (white drainage) or increased drainage or redness at the wound, or calf pain, call your surgeon's office.     Constipation Prevention    Complete by:  As directed   Drink plenty of fluids.  Prune juice may be helpful.  You may use a stool softener, such as Colace (over the counter) 100 mg twice a day.  Use MiraLax (over the counter)  for constipation as needed.     Diet - low sodium heart healthy    Complete by:  As directed      Discharge instructions    Complete by:  As directed   We recommend that you to take vitamin C 1000 mg a day to promote healing. We also recommend that if you require  pain medicine that you take a stool softener to prevent constipation as most pain medicines will have constipation side effects. We recommend either Peri-Colace or Senokot and recommend that you also consider adding MiraLAX as well to prevent the constipation affects from pain medicine if you are required to use them. These medicines are over the counter and may be purchased at a local pharmacy. A cup of yogurt and a probiotic can also be helpful during the recovery process as the medicines can disrupt your intestinal environment. Keep bandage clean and dry.  Call for any problems.  No smoking.  Criteria for driving a car: you should be off your pain medicine for 7-8 hours, able to drive one handed(confident), thinking clearly and feeling able in your judgement to drive.  Continue elevation as it will decrease swelling.  If instructed by MD move your fingers within the confines of the bandage/splint.  Use ice if instructed by your MD. Call immediately for any sudden loss of feeling in your hand/arm or change in functional abilities of the extremity.     Increase activity slowly as tolerated    Complete by:  As directed           Follow-up Information    Follow up with Karen ChafeGRAMIG III,WILLIAM M, MD. Schedule an appointment as soon as possible for a visit in 2 weeks.   Specialty:  Orthopedic Surgery   Why:  For suture removal, For wound re-check   Contact information:   [address] [place] [address] [phone]        Signed: Sheran LawlessBUCHANAN,Magdalen Cabana L 06/21/2015, 9:41 AM

## 2015-06-21 NOTE — Progress Notes (Signed)
PT Cancellation Note  Patient Details Name: Gregory Walsh MRN: [id number] DOB: 05/15/1965   Cancelled Treatment:    Reason Eval/Treat Not Completed: PT screened, no needs identified, will sign off. Discussed pt case with OT who states that pt does not require PT services at this time. Please re-consult if needs change.   Conni SlipperKirkman, Ramina Hulet 06/21/2015, 9:52 AM  Conni SlipperLaura Ednamae Schiano, PT, DPT Acute Rehabilitation Services Pager: [phone]

## 2015-06-21 NOTE — Discharge Instructions (Signed)

## 2015-06-21 NOTE — Evaluation (Signed)
Occupational Therapy Evaluation and Discharge Patient Details Name: Gregory Walsh MRN: [id number] DOB: 22-Apr-1965 Today's Date: 06/21/2015    History of Present Illness Pt is a 50 y.o. male s/p OPEN REDUCTION INTERNAL FIXATION (ORIF RIGHT ) DISTAL RADIAL FRACTURE WITH REPAIRS. PMHx: HTN, Hyperlipidemia, Diverticulosis, Anxiety, Muscle spasm.   Clinical Impression   Pt reports he was independent with ADLs and mobility PTA. Currently overall independent-mod I for ADLs and functional mobility with increased time secondary to pain. All ADL, safety, and edema management education completed. Pt planning to d/c home with 24/7 supervision initially. No further acute OT needs identified; signing off at this time. Please re-consult if needs change. Thank you for this referral.    Follow Up Recommendations  No OT follow up;Supervision - Intermittent    Equipment Recommendations  None recommended by OT    Recommendations for Other Services       Precautions / Restrictions Precautions Precautions: Fall Restrictions Other Position/Activity Restrictions: No orders in chart      Mobility Bed Mobility Overal bed mobility: Independent                Transfers Overall transfer level: Independent Equipment used: None                  Balance Overall balance assessment: No apparent balance deficits (not formally assessed)                                          ADL Overall ADL's : Modified independent                                       General ADL Comments: Educated pt on UB dressing technique and use of bag over RUE for bathing. Educated on edema management strategies: elevation, digit ROM, and retrograde massage. Discussed shoulder and elbow ROM throuhgout day to prevent stiffness. Discussed use of sling for at work and during mobility as needed.      Vision     Perception     Praxis      Pertinent Vitals/Pain Pain Assessment:  0-10 Pain Score: 9  Pain Location: R wrist Pain Descriptors / Indicators: Aching;Grimacing;Guarding Pain Intervention(s): Monitored during session;Patient requesting pain meds-RN notified     Hand Dominance Right   Extremity/Trunk Assessment Upper Extremity Assessment Upper Extremity Assessment: RUE deficits/detail RUE Deficits / Details: Limited digit ROM secondary to edema and pain. Shoulder and elbow ROM WFL. RUE: Unable to fully assess due to pain;Unable to fully assess due to immobilization   Lower Extremity Assessment Lower Extremity Assessment: Overall WFL for tasks assessed   Cervical / Trunk Assessment Cervical / Trunk Assessment: Normal   Communication Communication Communication: No difficulties   Cognition Arousal/Alertness: Awake/alert Behavior During Therapy: WFL for tasks assessed/performed Overall Cognitive Status: Within Functional Limits for tasks assessed                     General Comments       Exercises       Shoulder Instructions      Home Living Family/patient expects to be discharged to:: Private residence Living Arrangements: Spouse/significant other Available Help at Discharge: Family;Available 24 hours/day (Planning to stay with mother initially for 24/7 S) Type of Home: House  Home Layout: Two level;Bed/bath upstairs     Bathroom Shower/Tub: Producer, television/film/videoWalk-in shower   Bathroom Toilet: Standard     Home Equipment: None          Prior Functioning/Environment Level of Independence: Independent             OT Diagnosis: Acute pain   OT Problem List:     OT Treatment/Interventions:      OT Goals(Current goals can be found in the care plan section) Acute Rehab OT Goals Patient Stated Goal: decrease pain OT Goal Formulation: All assessment and education complete, DC therapy  OT Frequency:     Barriers to D/C:            Co-evaluation              End of Session Nurse Communication: Patient requests  pain meds;Other (comment) (ready to d/c from OT standpoint)  Activity Tolerance: Patient tolerated treatment well Patient left: with call bell/phone within reach;Other (comment) (sitting EOB)   Time: [id number] OT Time Calculation (min): 12 min Charges:  OT General Charges $OT Visit: 1 Procedure OT Evaluation $OT Eval Low Complexity: 1 Procedure G-Codes: OT G-codes **NOT FOR INPATIENT CLASS** Functional Assessment Tool Used: Clinical judgement Functional Limitation: Self care Self Care Current Status (V4098(G8987): 0 percent impaired, limited or restricted Self Care Goal Status (J1914(G8988): 0 percent impaired, limited or restricted Self Care Discharge Status (N8295(G8989): 0 percent impaired, limited or restricted   Gaye AlkenBailey A Humberto Addo M.S., OTR/L Pager: [phone]  06/21/2015, 9:59 AM

## 2015-06-22 ENCOUNTER — Encounter (HOSPITAL_COMMUNITY): Payer: Self-pay | Admitting: Orthopedic Surgery

## 2015-06-22 NOTE — Op Note (Signed)
NAME:  Gregory Walsh, Gregory Walsh                     ACCOUNT NO.:  MEDICAL RECORD NO.:  [id number]  LOCATION:                                 FACILITY:  PHYSICIAN:  Dionne Ano. Demontrez Rindfleisch, M.D.DATE OF BIRTH:  [birth year]  DATE OF PROCEDURE: DATE OF DISCHARGE:                              OPERATIVE REPORT   PREOPERATIVE DIAGNOSES: 1. Comminuted complex greater than 3-part intraarticular distal radius     fracture. 2. Ulnar styloid fracture. 3. Triquetral fracture (chip avulsion triquetral fracture), right     upper extremity.  POSTOPERATIVE DIAGNOSES: 1. Comminuted complex greater than 3-part intraarticular distal radius     fracture. 2. Ulnar styloid fracture. 3. Triquetral fracture (chip avulsion triquetral fracture), right     upper extremity.  PROCEDURE: 1. Open reduction and internal fixation greater than 3-part     intraarticular distal radius fracture with DVR extended volar rim     plate and screw construct. 2. Close treatment triquetral fracture, right wrist. 3. Closed treatment ulnar styloid fracture, right wrist. 4. AP lateral and oblique x-rays performed examined, interpreted by     myself, right upper extremity.  SURGEON:  Dionne Ano. Amanda Pea, MD  ASSISTANT:  Karie Chimera, PA-C  COMPLICATIONS:  None.  ANESTHESIA:  Block with IV sedation.  TOURNIQUET TIME:  Less than an hour.  DRAINS:  One.  INDICATIONS:  [name] presents with the above-mentioned diagnosis.  I have counseled him in regard to risks and benefits of surgery including risk of infection, bleeding, anesthesia, damage to neurovascular structures, and failure of surgery to accomplish its intended goals of relieving symptoms and restoring function.  With this in mind, he desires to proceed.  All questions have been encouraged and answered preoperatively.  OPERATIVE PROCEDURE:  The patient was seen by myself and Anesthesia, a block was placed in the preoperative holding area.  He underwent  a transfer to the operative theater, underwent a pre-scrub with Hibiclens scrub and paint.  IV sedation was given.  Time-out was called.  10 minutes surgical Betadine scrub and paint was accomplished.  Following this, arm was elevated, tourniquet was insufflated.  Volar radial incision was made.  FCR tendon sheath was incised dorsally and palmarly. Access to the floor of the wrist was then gained.  The carpal canal contents were swept ulnarly and the patient had the pronator incised without difficulty.  This was elevated and following this, the fracture was then reduced.  A DVR extended volar rim plate was placed without difficulty.  Adequate height inclination and volar tilt were restored with standard reduction techniques.  Plate was placed in standard AO fashion.  I was pleased with the findings.  AP, lateral, and oblique x-rays were performed, examined, and interpreted by myself, looked to be excellent.  Following this, we closed the pronator after irrigation and placed TLS drain followed by wound closure.  Following this, the patient had stress testing performed to the ulnar styloid which was stable deeming a competent TFC.  The triquetral fracture was an avulsion fracture and stable and thus we performed no surgical intervention for this.  This will be treated in a nonsurgical/conservative fashion. He was dressed  sterilely followed by short splint.  He will see us back in the office in 12-14 days and I am going to see for IV antibiotics, general postop observation, pain management.  These notes have been discussed.  Standard DVR rehab will be adhered to.     Dionne AnoWilliam M. Amanda PeaGramig, M.D.     [facility]  D:  06/20/2015  T:  06/21/2015  Job:  [id number]

## 2017-09-06 ENCOUNTER — Other Ambulatory Visit: Payer: Self-pay | Admitting: Internal Medicine

## 2017-09-06 DIAGNOSIS — N5089 Other specified disorders of the male genital organs: Secondary | ICD-10-CM

## 2017-09-14 ENCOUNTER — Ambulatory Visit
Admission: RE | Admit: 2017-09-14 | Discharge: 2017-09-14 | Disposition: A | Discharge status: Home / Self Care | Payer: Managed Care, Other (non HMO) | Source: Ambulatory Visit | Attending: Internal Medicine | Admitting: Internal Medicine

## 2017-09-14 DIAGNOSIS — N5089 Other specified disorders of the male genital organs: Secondary | ICD-10-CM

## 2017-09-20 ENCOUNTER — Other Ambulatory Visit: Payer: Managed Care, Other (non HMO)

## 2018-03-13 DIAGNOSIS — R82998 Other abnormal findings in urine: Secondary | ICD-10-CM | POA: Diagnosis not present

## 2018-03-13 DIAGNOSIS — R7309 Other abnormal glucose: Secondary | ICD-10-CM | POA: Diagnosis not present

## 2018-03-13 DIAGNOSIS — I10 Essential (primary) hypertension: Secondary | ICD-10-CM | POA: Diagnosis not present

## 2018-03-13 DIAGNOSIS — Z125 Encounter for screening for malignant neoplasm of prostate: Secondary | ICD-10-CM | POA: Diagnosis not present

## 2018-03-13 DIAGNOSIS — Z0000 Encounter for general adult medical examination without abnormal findings: Secondary | ICD-10-CM | POA: Diagnosis not present

## 2018-03-20 DIAGNOSIS — I517 Cardiomegaly: Secondary | ICD-10-CM | POA: Diagnosis not present

## 2018-03-20 DIAGNOSIS — I10 Essential (primary) hypertension: Secondary | ICD-10-CM | POA: Diagnosis not present

## 2018-03-20 DIAGNOSIS — Z0000 Encounter for general adult medical examination without abnormal findings: Secondary | ICD-10-CM | POA: Diagnosis not present

## 2018-03-20 DIAGNOSIS — Z1331 Encounter for screening for depression: Secondary | ICD-10-CM | POA: Diagnosis not present

## 2018-03-20 DIAGNOSIS — E7849 Other hyperlipidemia: Secondary | ICD-10-CM | POA: Diagnosis not present

## 2018-03-20 DIAGNOSIS — R7309 Other abnormal glucose: Secondary | ICD-10-CM | POA: Diagnosis not present

## 2018-03-23 DIAGNOSIS — Z1212 Encounter for screening for malignant neoplasm of rectum: Secondary | ICD-10-CM | POA: Diagnosis not present

## 2018-03-28 DIAGNOSIS — R195 Other fecal abnormalities: Secondary | ICD-10-CM | POA: Diagnosis not present

## 2018-04-03 DIAGNOSIS — K6389 Other specified diseases of intestine: Secondary | ICD-10-CM | POA: Diagnosis not present

## 2018-04-03 DIAGNOSIS — K635 Polyp of colon: Secondary | ICD-10-CM | POA: Diagnosis not present

## 2018-04-03 DIAGNOSIS — D128 Benign neoplasm of rectum: Secondary | ICD-10-CM | POA: Diagnosis not present

## 2018-04-03 DIAGNOSIS — D120 Benign neoplasm of cecum: Secondary | ICD-10-CM | POA: Diagnosis not present

## 2018-04-03 DIAGNOSIS — D125 Benign neoplasm of sigmoid colon: Secondary | ICD-10-CM | POA: Diagnosis not present

## 2018-04-03 DIAGNOSIS — R195 Other fecal abnormalities: Secondary | ICD-10-CM | POA: Diagnosis not present

## 2018-04-03 DIAGNOSIS — D123 Benign neoplasm of transverse colon: Secondary | ICD-10-CM | POA: Diagnosis not present

## 2018-04-03 DIAGNOSIS — K621 Rectal polyp: Secondary | ICD-10-CM | POA: Diagnosis not present

## 2018-04-14 ENCOUNTER — Encounter (HOSPITAL_COMMUNITY): Payer: Self-pay | Admitting: Emergency Medicine

## 2018-04-14 ENCOUNTER — Ambulatory Visit (INDEPENDENT_AMBULATORY_CARE_PROVIDER_SITE_OTHER): Payer: BLUE CROSS/BLUE SHIELD

## 2018-04-14 ENCOUNTER — Ambulatory Visit (HOSPITAL_COMMUNITY)
Admission: EM | Admit: 2018-04-14 | Discharge: 2018-04-14 | Disposition: A | Discharge status: Home / Self Care | Payer: BLUE CROSS/BLUE SHIELD | Attending: Family Medicine | Admitting: Family Medicine

## 2018-04-14 ENCOUNTER — Ambulatory Visit (HOSPITAL_COMMUNITY): Payer: BLUE CROSS/BLUE SHIELD

## 2018-04-14 DIAGNOSIS — W230XXA Caught, crushed, jammed, or pinched between moving objects, initial encounter: Secondary | ICD-10-CM | POA: Diagnosis not present

## 2018-04-14 DIAGNOSIS — S6702XA Crushing injury of left thumb, initial encounter: Secondary | ICD-10-CM | POA: Diagnosis not present

## 2018-04-14 DIAGNOSIS — S60112A Contusion of left thumb with damage to nail, initial encounter: Secondary | ICD-10-CM | POA: Diagnosis not present

## 2018-04-14 DIAGNOSIS — S61012A Laceration without foreign body of left thumb without damage to nail, initial encounter: Secondary | ICD-10-CM

## 2018-04-14 DIAGNOSIS — S62522A Displaced fracture of distal phalanx of left thumb, initial encounter for closed fracture: Secondary | ICD-10-CM | POA: Diagnosis not present

## 2018-04-14 MED ORDER — SULFAMETHOXAZOLE-TRIMETHOPRIM 800-160 MG PO TABS
1.0000 | ORAL_TABLET | Freq: Two times a day (BID) | ORAL | 0 refills | Status: AC
Start: 2018-04-14 — End: 2018-04-21

## 2018-04-14 MED ORDER — HYDROCODONE-ACETAMINOPHEN 5-325 MG PO TABS: 1.0000 | ORAL_TABLET | Freq: Four times a day (QID) | ORAL | 0 refills | Status: DC | PRN

## 2018-04-14 NOTE — Discharge Instructions (Addendum)
Be aware, pain medications may cause drowsiness. Please do not drive, operate heavy machinery or make important decisions while on this medication because it may cloud your judgement.  Keep your wound clean and as dry as possible. Do not immerse or soak your wound in water. This means no swimming, baths, or hot tubs until the stitches are removed. Leave original bandages on for the first 24 hours. After this time, showering or rinsing is recommended, rather than bathing. After the first 24 hours, remove old bandages and gently cleanse your wound with soap and water. Cleansing twice a day prevents buildup of debris and will result in easier suture removal. Your sutures may be removed by the hand surgeon.

## 2018-04-14 NOTE — ED Provider Notes (Addendum)
[facility]   [id number] 04/14/18 Arrival Time: 1553  ASSESSMENT & PLAN:  1. Crush injury to thumb, left, initial encounter   2. Thumb laceration, left, initial encounter   3. Subungual hematoma of left thumb, initial encounter    I have personally viewed the imaging studies ordered this visit. Comminuted distal thumb fracture.  Low pressure irrigation with sterile isotonic saline solution. Phone consultation with Dr Merlyn Lot on call for hand.   Meds ordered this encounter  Medications  . sulfamethoxazole-trimethoprim (BACTRIM DS,SEPTRA DS) 800-160 MG tablet    Sig: Take 1 tablet by mouth 2 (two) times daily for 7 days.    Dispense:  14 tablet    Refill:  0  . DISCONTD: HYDROcodone-acetaminophen (NORCO/VICODIN) 5-325 MG tablet    Sig: Take 1 tablet by mouth every 6 (six) hours as needed for moderate pain or severe pain.    Dispense:  12 tablet    Refill:  0    Orders Placed This Encounter  Procedures  . DG Finger Thumb Left    Procedure: Laceration repair. Verbal consent obtained. Patient provided with risks and alternatives to the procedure. Wound copiously irrigated with NS then cleansed with betadine. Digital block performed with 1% lidocaine without epinephrine. Wounds carefully explored. No foreign bodies, tendon injury, or nonviable tissue were noted. Using sterile technique at total of 8 (6 + 2) simple interrupted 5-0 Prolene sutures were placed to reapproximate the wounds. Patient tolerated procedure well. No complications. Minimal bleeding. Patient advised to look for and return for any signs of infection such as redness, swelling, discharge, or worsening pain. Sutures may be removed by hand surgeon upon f/u.   Procedure: Subungual hematoma evacuation. Verbal consent obtained. Nail cleaned. Cautery pen to place single hole in his nail. Drainage of dark red blood. No complications.  Splinted and bandaged by RN.  [place] Controlled Substances Registry consulted  for this patient. I feel the risk/benefit ratio today is favorable for proceeding with this prescription for a controlled substance. Medication sedation precautions given.  Reviewed expectations re: course of current medical issues. Questions answered. Outlined signs and symptoms indicating need for more acute intervention. Patient verbalized understanding. After Visit Summary given.  SUBJECTIVE: History from: patient. Gregory Walsh is a right hand dominant 53 y.o. male who reports a crush injury of his left distal thumb today. Caught in a log splitter. Wearing a glove at the time of injury; able to remove easily. Moderate bleeding initially from two lacerations; now minimal; more with thumb movement. Pain steady and aching in nature. Reports hearing a slight pop when he attempts to move his thumb. Aggravating factors: any movement of thumb. Alleviating factors: none reported. No loss of sensation. Self treatment: washed; no OTC analgesics taken.  Reports Td is UTD.  Past Surgical History:  Procedure Laterality Date  . COLONOSCOPY    . HERNIA REPAIR  80's  . OPEN REDUCTION INTERNAL FIXATION (ORIF) DISTAL RADIAL FRACTURE Right 06/20/2015   Procedure: OPEN REDUCTION INTERNAL FIXATION (ORIF RIGHT ) DISTAL RADIAL FRACTURE WITH REPAIRS ;  Surgeon: Dominica Severin, MD;  Location: [facility] OR;  Service: Orthopedics;  Laterality: Right;  . WISDOM TOOTH EXTRACTION      ROS: As per HPI. All other systems negative.  OBJECTIVE:  Vitals:   04/14/18 1603  BP: 117/75  Pulse: 76  Resp: 16  Temp: 99.2 F (37.3 C)  SpO2: 97%    General appearance: alert; no distress HEENT: [place]; AT Extremities: . L thumb: warm and  well perfused; diffuse tenderness over distal thumb; no partial amputation; without gross deformities except for two lacerations (one approx 1 cm in size dorsally; the other approx 2 cm in size at tip of thumb without nail involvement); with mild swelling; with no bruising; ROM: able to  slightly flex and extend thumb but limited secondary to pain; small pop/click appreciated with thumb flexion; thumbnail is intact with a small subungual hematoma present CV: brisk extremity capillary refill of left thumb; 2+ radial pulse of LUE. Skin: warm and dry; no visible rashes Neurologic: gait normal; normal distal sensation of left thumb; difficult to assess strength of thumb secondary to injuries Psychological: alert and cooperative; normal mood and affect  Imaging: Dg Finger Thumb Left  Result Date: 04/14/2018 CLINICAL DATA:  Split thumb while splitting wood, initial encounter EXAM: LEFT THUMB 2+V COMPARISON:  None. FINDINGS: Comminuted fracture of the first distal phalanx is noted with mild displacement of the fracture fragments. Associated soft tissue swelling is seen. Soft tissue defect consistent with laceration are noted as well. IMPRESSION: Comminuted first distal phalangeal fracture with associated soft tissue injury. Electronically Signed   By: Alcide Clever M.D.   On: 04/14/2018 16:26   No Known Allergies  Past Medical History:  Diagnosis Date  . Anxiety    takes Xanax as needed  . Diverticulosis   . History of colonoscopy   . History of migraine    takes Maxalt daily as needed  . Hyperlipidemia    takes Atorvastatin daily  . Hypertension    takes Atenolol daily  . Muscle spasm    takes Flexeril daily as needed   Social History   Socioeconomic History  . Marital status: Married    Spouse name: Not on file  . Number of children: Not on file  . Years of education: Not on file  . Highest education level: Not on file  Occupational History  . Not on file  Social Needs  . Financial resource strain: Not on file  . Food insecurity:    Worry: Not on file    Inability: Not on file  . Transportation needs:    Medical: Not on file    Non-medical: Not on file  Tobacco Use  . Smoking status: Never Smoker  Substance and Sexual Activity  . Alcohol use: No  . Drug  use: No  . Sexual activity: Yes  Lifestyle  . Physical activity:    Days per week: Not on file    Minutes per session: Not on file  . Stress: Not on file  Relationships  . Social connections:    Talks on phone: Not on file    Gets together: Not on file    Attends religious service: Not on file    Active member of club or organization: Not on file    Attends meetings of clubs or organizations: Not on file    Relationship status: Not on file  Other Topics Concern  . Not on file  Social History Narrative  . Not on file   FH: Question of HTN  Past Surgical History:  Procedure Laterality Date  . COLONOSCOPY    . HERNIA REPAIR  80's  . OPEN REDUCTION INTERNAL FIXATION (ORIF) DISTAL RADIAL FRACTURE Right 06/20/2015   Procedure: OPEN REDUCTION INTERNAL FIXATION (ORIF RIGHT ) DISTAL RADIAL FRACTURE WITH REPAIRS ;  Surgeon: Dominica Severin, MD;  Location: [facility] OR;  Service: Orthopedics;  Laterality: Right;  . WISDOM TOOTH EXTRACTION  Mardella Layman, MD 04/18/18 3903    Mardella Layman, MD 04/18/18 [phone]

## 2018-04-14 NOTE — ED Triage Notes (Signed)
Pt states he crushed his L thumb in a wood cutter. Okay to see per DR. Halger.

## 2018-04-17 ENCOUNTER — Ambulatory Visit (HOSPITAL_BASED_OUTPATIENT_CLINIC_OR_DEPARTMENT_OTHER): Payer: BLUE CROSS/BLUE SHIELD | Admitting: Certified Registered"

## 2018-04-17 ENCOUNTER — Other Ambulatory Visit: Payer: Self-pay | Admitting: Orthopedic Surgery

## 2018-04-17 ENCOUNTER — Ambulatory Visit (HOSPITAL_BASED_OUTPATIENT_CLINIC_OR_DEPARTMENT_OTHER)
Admission: RE | Admit: 2018-04-17 | Discharge: 2018-04-17 | Disposition: A | Discharge status: Home / Self Care | Payer: BLUE CROSS/BLUE SHIELD | Source: Ambulatory Visit | Attending: Orthopedic Surgery | Admitting: Orthopedic Surgery

## 2018-04-17 ENCOUNTER — Encounter (HOSPITAL_BASED_OUTPATIENT_CLINIC_OR_DEPARTMENT_OTHER): Payer: Self-pay | Admitting: *Deleted

## 2018-04-17 ENCOUNTER — Encounter (HOSPITAL_BASED_OUTPATIENT_CLINIC_OR_DEPARTMENT_OTHER)
Admission: RE | Disposition: A | Discharge status: Home / Self Care | Payer: Self-pay | Source: Ambulatory Visit | Attending: Orthopedic Surgery

## 2018-04-17 ENCOUNTER — Other Ambulatory Visit: Payer: Self-pay

## 2018-04-17 DIAGNOSIS — S62522B Displaced fracture of distal phalanx of left thumb, initial encounter for open fracture: Secondary | ICD-10-CM | POA: Insufficient documentation

## 2018-04-17 DIAGNOSIS — W230XXA Caught, crushed, jammed, or pinched between moving objects, initial encounter: Secondary | ICD-10-CM | POA: Insufficient documentation

## 2018-04-17 DIAGNOSIS — M62838 Other muscle spasm: Secondary | ICD-10-CM | POA: Diagnosis not present

## 2018-04-17 DIAGNOSIS — S61112A Laceration without foreign body of left thumb with damage to nail, initial encounter: Secondary | ICD-10-CM | POA: Insufficient documentation

## 2018-04-17 DIAGNOSIS — Z79899 Other long term (current) drug therapy: Secondary | ICD-10-CM | POA: Insufficient documentation

## 2018-04-17 DIAGNOSIS — F419 Anxiety disorder, unspecified: Secondary | ICD-10-CM | POA: Insufficient documentation

## 2018-04-17 DIAGNOSIS — S62522A Displaced fracture of distal phalanx of left thumb, initial encounter for closed fracture: Secondary | ICD-10-CM | POA: Insufficient documentation

## 2018-04-17 DIAGNOSIS — S6702XA Crushing injury of left thumb, initial encounter: Secondary | ICD-10-CM | POA: Diagnosis not present

## 2018-04-17 DIAGNOSIS — I10 Essential (primary) hypertension: Secondary | ICD-10-CM | POA: Insufficient documentation

## 2018-04-17 HISTORY — PX: INCISION AND DRAINAGE WOUND WITH NAILBED REPAIR: SHX5636

## 2018-04-17 HISTORY — PX: CLOSED REDUCTION METACARPAL WITH PERCUTANEOUS PINNING: SHX5613

## 2018-04-17 SURGERY — CLOSED REDUCTION, FRACTURE, METACARPAL BONE, WITH PERCUTANEOUS PINNING
Anesthesia: Monitor Anesthesia Care | Site: Thumb | Laterality: Left

## 2018-04-17 MED ORDER — FENTANYL CITRATE (PF) 100 MCG/2ML IJ SOLN
INTRAMUSCULAR | Status: AC
  Filled 2018-04-17: qty 2

## 2018-04-17 MED ORDER — CHLORHEXIDINE GLUCONATE 4 % EX LIQD: 60.0000 mL | Freq: Once | CUTANEOUS | Status: DC

## 2018-04-17 MED ORDER — OXYCODONE HCL 5 MG PO TABS: ORAL_TABLET | ORAL | 0 refills | Status: DC

## 2018-04-17 MED ORDER — FENTANYL CITRATE (PF) 100 MCG/2ML IJ SOLN: 25.0000 ug | INTRAMUSCULAR | Status: DC | PRN

## 2018-04-17 MED ORDER — ONDANSETRON HCL 4 MG/2ML IJ SOLN: 4.0000 mg | Freq: Once | INTRAMUSCULAR | Status: DC | PRN

## 2018-04-17 MED ORDER — LIDOCAINE HCL (PF) 1 % IJ SOLN
INTRAMUSCULAR | Status: DC | PRN
  Administered 2018-04-17: 5 mL

## 2018-04-17 MED ORDER — LIDOCAINE HCL (PF) 1 % IJ SOLN
INTRAMUSCULAR | Status: AC
  Filled 2018-04-17: qty 30

## 2018-04-17 MED ORDER — OXYCODONE HCL 5 MG/5ML PO SOLN: 5.0000 mg | Freq: Once | ORAL | Status: DC | PRN

## 2018-04-17 MED ORDER — FENTANYL CITRATE (PF) 100 MCG/2ML IJ SOLN
50.0000 ug | INTRAMUSCULAR | Status: DC | PRN
  Administered 2018-04-17: 50 ug via INTRAVENOUS

## 2018-04-17 MED ORDER — SCOPOLAMINE 1 MG/3DAYS TD PT72: 1.0000 | MEDICATED_PATCH | Freq: Once | TRANSDERMAL | Status: DC | PRN

## 2018-04-17 MED ORDER — MIDAZOLAM HCL 2 MG/2ML IJ SOLN
INTRAMUSCULAR | Status: AC
  Filled 2018-04-17: qty 2

## 2018-04-17 MED ORDER — LACTATED RINGERS IV SOLN
INTRAVENOUS | Status: DC
  Administered 2018-04-17: 14:00:00 via INTRAVENOUS

## 2018-04-17 MED ORDER — BUPIVACAINE HCL (PF) 0.25 % IJ SOLN
INTRAMUSCULAR | Status: DC | PRN
  Administered 2018-04-17: 5 mL

## 2018-04-17 MED ORDER — CEFAZOLIN SODIUM-DEXTROSE 2-4 GM/100ML-% IV SOLN
INTRAVENOUS | Status: AC
  Filled 2018-04-17: qty 100

## 2018-04-17 MED ORDER — CEFAZOLIN SODIUM-DEXTROSE 2-4 GM/100ML-% IV SOLN
2.0000 g | INTRAVENOUS | Status: AC
  Administered 2018-04-17: 2 g via INTRAVENOUS

## 2018-04-17 MED ORDER — OXYCODONE HCL 5 MG PO TABS: 5.0000 mg | ORAL_TABLET | Freq: Once | ORAL | Status: DC | PRN

## 2018-04-17 MED ORDER — MIDAZOLAM HCL 2 MG/2ML IJ SOLN: 1.0000 mg | INTRAMUSCULAR | Status: DC | PRN

## 2018-04-17 MED ORDER — BUPIVACAINE HCL (PF) 0.25 % IJ SOLN
INTRAMUSCULAR | Status: AC
  Filled 2018-04-17: qty 30

## 2018-04-17 SURGICAL SUPPLY — 68 items
BANDAGE ACE 3X5.8 VEL STRL LF (GAUZE/BANDAGES/DRESSINGS) ×1 IMPLANT
BLADE MINI RND TIP GREEN BEAV (BLADE) ×1 IMPLANT
BLADE SURG 15 STRL LF DISP TIS (BLADE) ×2 IMPLANT
BLADE SURG 15 STRL SS (BLADE) ×4
BNDG CMPR 9X4 STRL LF SNTH (GAUZE/BANDAGES/DRESSINGS)
BNDG COHESIVE 1X5 TAN STRL LF (GAUZE/BANDAGES/DRESSINGS) ×2 IMPLANT
BNDG CONFORM 2 STRL LF (GAUZE/BANDAGES/DRESSINGS) IMPLANT
BNDG ELASTIC 2X5.8 VLCR STR LF (GAUZE/BANDAGES/DRESSINGS) IMPLANT
BNDG ESMARK 4X9 LF (GAUZE/BANDAGES/DRESSINGS) ×1 IMPLANT
BNDG GAUZE ELAST 4 BULKY (GAUZE/BANDAGES/DRESSINGS) ×1 IMPLANT
CHLORAPREP W/TINT 26ML (MISCELLANEOUS) ×1 IMPLANT
CORD BIPOLAR FORCEPS 12FT (ELECTRODE) ×1 IMPLANT
COVER BACK TABLE 60X90IN (DRAPES) ×2 IMPLANT
COVER MAYO STAND STRL (DRAPES) ×2 IMPLANT
COVER WAND RF STERILE (DRAPES) IMPLANT
CUFF TOURNIQUET SINGLE 18IN (TOURNIQUET CUFF) ×1 IMPLANT
DRAPE EXTREMITY T 121X128X90 (DISPOSABLE) ×2 IMPLANT
DRAPE OEC MINIVIEW 54X84 (DRAPES) ×2 IMPLANT
DRAPE SURG 17X23 STRL (DRAPES) ×2 IMPLANT
GAUZE SPONGE 4X4 12PLY STRL (GAUZE/BANDAGES/DRESSINGS) ×2 IMPLANT
GAUZE XEROFORM 1X8 LF (GAUZE/BANDAGES/DRESSINGS) ×2 IMPLANT
GLOVE BIO SURGEON STRL SZ 6.5 (GLOVE) ×1 IMPLANT
GLOVE BIO SURGEON STRL SZ7.5 (GLOVE) ×2 IMPLANT
GLOVE BIOGEL PI IND STRL 7.0 (GLOVE) IMPLANT
GLOVE BIOGEL PI IND STRL 8 (GLOVE) ×1 IMPLANT
GLOVE BIOGEL PI IND STRL 8.5 (GLOVE) IMPLANT
GLOVE BIOGEL PI INDICATOR 7.0 (GLOVE) ×2
GLOVE BIOGEL PI INDICATOR 8 (GLOVE) ×1
GLOVE BIOGEL PI INDICATOR 8.5 (GLOVE) ×1
GLOVE SURG ORTHO 8.0 STRL STRW (GLOVE) ×1 IMPLANT
GOWN STRL REUS W/ TWL LRG LVL3 (GOWN DISPOSABLE) ×1 IMPLANT
GOWN STRL REUS W/TWL LRG LVL3 (GOWN DISPOSABLE) ×2
GOWN STRL REUS W/TWL XL LVL3 (GOWN DISPOSABLE) ×3 IMPLANT
K-WIRE .035X4 (WIRE) ×1 IMPLANT
NDL HYPO 25X1 1.5 SAFETY (NEEDLE) IMPLANT
NEEDLE HYPO 22GX1.5 SAFETY (NEEDLE) IMPLANT
NEEDLE HYPO 25X1 1.5 SAFETY (NEEDLE) ×2 IMPLANT
NS IRRIG 1000ML POUR BTL (IV SOLUTION) ×2 IMPLANT
PACK BASIN DAY SURGERY FS (CUSTOM PROCEDURE TRAY) ×2 IMPLANT
PAD CAST 3X4 CTTN HI CHSV (CAST SUPPLIES) ×1 IMPLANT
PAD CAST 4YDX4 CTTN HI CHSV (CAST SUPPLIES) IMPLANT
PADDING CAST ABS 4INX4YD NS (CAST SUPPLIES)
PADDING CAST ABS COTTON 4X4 ST (CAST SUPPLIES) ×1 IMPLANT
PADDING CAST COTTON 3X4 STRL (CAST SUPPLIES)
PADDING CAST COTTON 4X4 STRL (CAST SUPPLIES)
SLEEVE SCD COMPRESS KNEE MED (MISCELLANEOUS) IMPLANT
SPLINT FINGER 3.25 BULB 911905 (SOFTGOODS) ×1 IMPLANT
SPLINT PLASTER CAST XFAST 3X15 (CAST SUPPLIES) IMPLANT
SPLINT PLASTER CAST XFAST 4X15 (CAST SUPPLIES) IMPLANT
SPLINT PLASTER XTRA FAST SET 4 (CAST SUPPLIES)
SPLINT PLASTER XTRA FASTSET 3X (CAST SUPPLIES)
STOCKINETTE 4X48 STRL (DRAPES) ×2 IMPLANT
SUT CHROMIC 5 0 P 3 (SUTURE) IMPLANT
SUT CHROMIC 6 0 PS 4 (SUTURE) ×1 IMPLANT
SUT ETHILON 3 0 PS 1 (SUTURE) IMPLANT
SUT ETHILON 4 0 PS 2 18 (SUTURE) ×2 IMPLANT
SUT MERSILENE 4 0 P 3 (SUTURE) IMPLANT
SUT MON AB 5-0 P3 18 (SUTURE) ×1 IMPLANT
SUT VIC AB 3-0 PS1 18 (SUTURE)
SUT VIC AB 3-0 PS1 18XBRD (SUTURE) IMPLANT
SUT VIC AB 5-0 P-3 18X BRD (SUTURE) IMPLANT
SUT VIC AB 5-0 P3 18 (SUTURE)
SUT VICRYL 4-0 PS2 18IN ABS (SUTURE) IMPLANT
SYR BULB 3OZ (MISCELLANEOUS) ×2 IMPLANT
SYR CONTROL 10ML LL (SYRINGE) ×1 IMPLANT
TOWEL GREEN STERILE FF (TOWEL DISPOSABLE) ×3 IMPLANT
TRAY DSU PREP LF (CUSTOM PROCEDURE TRAY) ×1 IMPLANT
UNDERPAD 30X30 (UNDERPADS AND DIAPERS) ×2 IMPLANT

## 2018-04-17 NOTE — Progress Notes (Signed)
Assisted Dr. Betha Loa with Left thumb digital block. Side rails up, monitors on throughout procedure. See vital signs in flow sheet. Tolerated Procedure well.

## 2018-04-17 NOTE — Discharge Instructions (Addendum)

## 2018-04-17 NOTE — Op Note (Signed)
NAME: Gregory Walsh MEDICAL RECORD NO: [id number] DATE OF BIRTH: 1965-11-24 FACILITY: [facility] LOCATION: [facility] PHYSICIAN: Tami Ribas, MD   OPERATIVE REPORT   DATE OF PROCEDURE: 04/17/18    PREOPERATIVE DIAGNOSIS:   Left thumb crush injury with distal phalanx fracture   POSTOPERATIVE DIAGNOSIS:   Left thumb crush injury with open distal phalanx fracture, skin and nailbed lacerations   PROCEDURE:   1.  Left thumb open treatment of open distal phalanx fracture with pinning 2.  Left thumb irrigation debridement of open distal phalanx fracture including skin subcutaneous tissues 3.  Left thumb repair of skin and nailbed lacerations   SURGEON:  Betha Loa, M.D.   ASSISTANT: Cindee Salt, MD   ANESTHESIA:  Local   INTRAVENOUS FLUIDS:  Per anesthesia flow sheet.   ESTIMATED BLOOD LOSS:  Minimal.   COMPLICATIONS:  None.   SPECIMENS:  none   TOURNIQUET TIME:    Total Tourniquet Time Documented: area (Left) - 17 minutes Total: area (Left) - 17 minutes    DISPOSITION:  Stable to PACU.   INDICATIONS: 53 year old male states he injured his left thumb in a log splitter 3 days ago.  He was seen at urgent care where the wound was cleaned and sutured.  He followed up in the office.  Recommended operative reduction and fixation of the distal phalanx fracture with repair of skin and nailbed lacerations. Risks, benefits and alternatives of surgery were discussed including the risks of blood loss, infection, damage to nerves, vessels, tendons, ligaments, bone for surgery, need for additional surgery, complications with wound healing, continued pain, nonunion, malunion, stiffness.  He voiced understanding of these risks and elected to proceed.  OPERATIVE COURSE:  After being identified preoperatively by myself,  the patient and I agreed on the procedure and site of the procedure.  The surgical site was marked.  Surgical consent had been signed. He was given IV Ancef as  preoperative antibiotic prophylaxis.  A digital block was performed in preoperative holding with 10 cc of half-and-half solution 1% plain lidocaine and quarter percent plain Marcaine. He was transferred to the operating room and placed on the operating table in supine position with the Left upper extremity on an arm board. Left upper extremity was prepped and draped in normal sterile orthopedic fashion.  A surgical pause was performed between the surgeons, anesthesia, and operating room staff and all were in agreement as to the patient, procedure, and site of procedure.  A Penrose drain was used as a tourniquet..    Sutures were removed.  The wounds were explored.  The distal aspect of the volar wound communicated with the fracture site.  There was not gross contamination.  There was contaminated hematoma which was removed with the pickups.  A knife and scissors were used to debride the skin and subcutaneous tissue sharply removing any devitalized tissue.  The nail was removed with a freer elevator.  There was a small laceration to the nailbed more toward the ulnar side.  The wounds were all copiously irrigated with sterile saline.  The C-arm was used in AP and lateral projections.  The distal phalanx fracture was reduced.  This was confirmed by C arm.  A 0.035 inch K wires advanced from the tuft of the thumb across the fracture site and across the IP joint.  This was adequate to stabilize the fracture.  The pin was bent and cut short.  The skin wounds were closed with a 5-0 Monocryl suture  in an interrupted fashion.  A 6-0 chromic was used to reapproximate the nailbed laceration.  A piece of Xeroform was placed in the nail fold and the wounds dressed with sterile Xeroform and 4 x 4 and wrapped with a Coban dressing lightly.  An AlumaFoam splint was placed and wrapped lightly with Coban dressing.  The Penrose drain was removed at 17 minutes.  Fingertips were pink with brisk capillary refill after deflation of  tourniquet.  The operative  drapes were broken down.  The patient was awoken from anesthesia safely.  He was transferred back to the stretcher and taken to PACU in stable condition.  I will see him back in the office in 1 week for postoperative followup.  I will give him a prescription for Oxycodone 5 mg 1-2 p.o. every 6 hours.  Pain dispense #20 and Bactrim DS 1 p.o. twice daily x7 days.Betha Loa, MD Electronically signed, 04/17/18

## 2018-04-17 NOTE — H&P (Signed)
Gregory Walsh is an 53 y.o. male.   Chief Complaint: left thumb fracture HPI: 53 yo male states he injured left thumb in log splitter 3 days ago.  Seen in [facility] where wounds sutured.  Followed up in office.  He wishes to proceed with operative fixation of fracture and repair of lacerations.  Allergies: No Known Allergies  Past Medical History:  Diagnosis Date  . Anxiety    takes Xanax as needed  . Diverticulosis   . History of colonoscopy   . History of migraine    takes Maxalt daily as needed  . Hyperlipidemia    takes Atorvastatin daily  . Hypertension    takes Atenolol daily  . Muscle spasm    takes Flexeril daily as needed    Past Surgical History:  Procedure Laterality Date  . COLONOSCOPY    . HERNIA REPAIR  80's  . OPEN REDUCTION INTERNAL FIXATION (ORIF) DISTAL RADIAL FRACTURE Right 06/20/2015   Procedure: OPEN REDUCTION INTERNAL FIXATION (ORIF RIGHT ) DISTAL RADIAL FRACTURE WITH REPAIRS ;  Surgeon: Dominica Severin, MD;  Location: [facility] OR;  Service: Orthopedics;  Laterality: Right;  . WISDOM TOOTH EXTRACTION      Family History: No family history on file.  Social History:   reports that he has never smoked. He does not have any smokeless tobacco history on file. He reports that he does not drink alcohol or use drugs.  Medications: Medications Prior to Admission  Medication Sig Dispense Refill  . Acetaminophen-Caffeine (EXCEDRIN TENSION HEADACHE) 500-65 MG TABS Take 1 tablet by mouth 2 (two) times daily as needed (for headache).     . ALPRAZolam (XANAX) 1 MG tablet Take 1 mg by mouth daily as needed for anxiety or sleep.   2  . atenolol (TENORMIN) 50 MG tablet Take 50 mg by mouth daily.  2  . atorvastatin (LIPITOR) 10 MG tablet Take 10 mg by mouth daily.  6  . cyclobenzaprine (FLEXERIL) 10 MG tablet Take 10 mg by mouth daily as needed for muscle spasms.   1  . HYDROcodone-acetaminophen (NORCO/VICODIN) 5-325 MG tablet Take 1 tablet by mouth every 6 (six) hours as needed  for moderate pain or severe pain. 12 tablet 0  . lisinopril (PRINIVIL,ZESTRIL) 10 MG tablet Take 10 mg by mouth daily.    . metoprolol tartrate (LOPRESSOR) 100 MG tablet Take 100 mg by mouth 2 (two) times daily.    . Multiple Vitamin (MULTIVITAMIN) tablet Take 1 tablet by mouth daily.    Marland Kitchen oxyCODONE-acetaminophen (PERCOCET/ROXICET) 5-325 MG tablet Take 1-2 tablets by mouth every 4 (four) hours as needed for moderate pain. 50 tablet 0  . promethazine (PHENERGAN) 25 MG tablet Take 1 tablet by mouth every 8 (eight) hours as needed for nausea or vomiting.   3  . rizatriptan (MAXALT) 10 MG tablet Take 10 mg by mouth as needed for migraine (May repeat in 2 hours, if needed.).     Marland Kitchen sulfamethoxazole-trimethoprim (BACTRIM DS,SEPTRA DS) 800-160 MG tablet Take 1 tablet by mouth 2 (two) times daily for 7 days. 14 tablet 0    No results found for this or any previous visit (from the past 48 hour(s)).  No results found.   A comprehensive review of systems was negative except for: Gastrointestinal: positive for nausea Hematologic/lymphatic: positive for easy bruising  There were no vitals taken for this visit.  General appearance: alert, cooperative and appears stated age Head: Normocephalic, without obvious abnormality, atraumatic Neck: supple, symmetrical, trachea midline Cardio: regular  rate and rhythm Resp: clear to auscultation bilaterally Extremities: Intact sensation and capillary refill all digits.  +epl/fpl/io.  Left thumb swollen.  Sutures in place in wounds.   Pulses: 2+ and symmetric Skin: Skin color, texture, turgor normal. No rashes or lesions Neurologic: Grossly normal Incision/Wound: as above  Assessment/Plan Left thumb crush injury with distal phalanx fracture.  Non operative and operative treatment options have been discussed with the patient and patient wishes to proceed with operative treatment. Risks, benefits, and alternatives of surgery have been discussed and the patient  agrees with the plan of care.   Betha Loa 04/17/2018, 1:22 PM

## 2018-04-17 NOTE — Op Note (Signed)
I assisted Surgeon(s) and Role:    * Betha Loa, MD - Primary    Cindee Salt, MD - Assisting on the Procedure(s): CLOSED REDUCTION LEFT THUMB DISTAL PHALYNX WITH PERCUTANEOUS PINNING AND NAILBED AND SKIN REPAIR INCISION AND DRAINAGE WOUND WITH NAILBED REPAIR on 04/17/2018.  I provided assistance on this case as follows: setup, debridement repair of the nailbed, stabilization of the fracture, fixation of the fracture,closure and application of the dressing and splint  Electronically signed by: Cindee Salt, MD Date: 04/17/2018 Time: 4:10 PM

## 2018-04-17 NOTE — Anesthesia Preprocedure Evaluation (Signed)
Anesthesia Evaluation  Patient identified by MRN, date of birth, ID band Patient awake    Reviewed: Allergy & Precautions, NPO status , Patient's Chart, lab work & pertinent test results, reviewed documented beta blocker date and time   History of Anesthesia Complications Negative for: history of anesthetic complications  Airway Mallampati: II  TM Distance: >3 FB Neck ROM: Full    Dental no notable dental hx.    Pulmonary neg pulmonary ROS,    Pulmonary exam normal        Cardiovascular hypertension, Pt. on medications and Pt. on home beta blockers Normal cardiovascular exam     Neuro/Psych negative neurological ROS  negative psych ROS   GI/Hepatic negative GI ROS, (+)     substance abuse  alcohol use,   Endo/Other  negative endocrine ROS  Renal/GU negative Renal ROS  negative genitourinary   Musculoskeletal negative musculoskeletal ROS (+)   Abdominal   Peds  Hematology negative hematology ROS (+)   Anesthesia Other Findings   Reproductive/Obstetrics                             Anesthesia Physical Anesthesia Plan  ASA: II  Anesthesia Plan: MAC   Post-op Pain Management:    Induction:   PONV Risk Score and Plan: 1 and Treatment may vary due to age or medical condition  Airway Management Planned: Natural Airway and Nasal Cannula  Additional Equipment: None  Intra-op Plan:   Post-operative Plan:   Informed Consent: I have reviewed the patients History and Physical, chart, labs and discussed the procedure including the risks, benefits and alternatives for the proposed anesthesia with the patient or authorized representative who has indicated his/her understanding and acceptance.       Plan Discussed with:   Anesthesia Plan Comments: (Pt ate full meal and drank 3 beers at 0830. He does not appear intoxicated at this time. If case is prior to 1630 will plan for local  only by surgeon. Pt understands and is agreeable.)        Anesthesia Quick Evaluation

## 2018-04-17 NOTE — Anesthesia Postprocedure Evaluation (Signed)
Anesthesia Post Note  Patient: AIVEN KAMPE  Procedure(s) Performed: CLOSED REDUCTION LEFT THUMB DISTAL PHALYNX WITH PERCUTANEOUS PINNING AND NAILBED AND SKIN REPAIR (Left Thumb) INCISION AND DRAINAGE WOUND WITH NAILBED REPAIR (Left Thumb)     Patient location during evaluation: PACU Anesthesia Type: MAC Level of consciousness: awake and alert Pain management: pain level controlled Vital Signs Assessment: post-procedure vital signs reviewed and stable Respiratory status: spontaneous breathing, nonlabored ventilation and respiratory function stable Cardiovascular status: blood pressure returned to baseline and stable Postop Assessment: no apparent nausea or vomiting Anesthetic complications: no    Last Vitals:  Vitals:   04/17/18 1525 04/17/18 1615  BP: (!) 136/95 (!) 152/101  Pulse: 81 78  Resp: 15 18  Temp:  37.3 C  SpO2: 98% 100%    Last Pain:  Vitals:   04/17/18 1615  TempSrc:   PainSc: 0-No pain                 Lidia Collum

## 2018-04-17 NOTE — Transfer of Care (Signed)
Immediate Anesthesia Transfer of Care Note  Patient: Gregory Walsh  Procedure(s) Performed: CLOSED REDUCTION LEFT THUMB DISTAL PHALYNX WITH PERCUTANEOUS PINNING AND NAILBED AND SKIN REPAIR (Left Thumb) INCISION AND DRAINAGE WOUND WITH NAILBED REPAIR (Left Thumb)  Patient Location: PACU  Anesthesia Type:local  Level of Consciousness: awake, alert  and oriented  Airway & Oxygen Therapy: Patient Spontanous Breathing  Post-op Assessment: Report given to RN  Post vital signs: Reviewed and stable  Last Vitals:  Vitals Value Taken Time  BP    Temp    Pulse    Resp    SpO2      Last Pain:  Vitals:   04/17/18 1505  TempSrc:   PainSc: 3       Patients Stated Pain Goal: 3 (04/17/18 1505)  Complications: no anesthesia given

## 2018-04-18 ENCOUNTER — Encounter (HOSPITAL_BASED_OUTPATIENT_CLINIC_OR_DEPARTMENT_OTHER): Payer: Self-pay | Admitting: Orthopedic Surgery

## 2018-04-18 NOTE — Addendum Note (Signed)
Addendum  created 04/18/18 0915 by Lance Coon, CRNA   Charge Capture section accepted

## 2018-04-24 DIAGNOSIS — M79645 Pain in left finger(s): Secondary | ICD-10-CM | POA: Diagnosis not present

## 2018-04-24 DIAGNOSIS — S62522A Displaced fracture of distal phalanx of left thumb, initial encounter for closed fracture: Secondary | ICD-10-CM | POA: Diagnosis not present

## 2018-04-24 DIAGNOSIS — S61112A Laceration without foreign body of left thumb with damage to nail, initial encounter: Secondary | ICD-10-CM | POA: Diagnosis not present

## 2018-04-25 NOTE — Op Note (Signed)
Intra-operative fluoroscopic images in the AP, lateral, and oblique views were taken and evaluated by myself.  Reduction and hardware placement were confirmed.  There was no intraarticular penetration of permanent hardware.  

## 2018-04-30 DIAGNOSIS — M79645 Pain in left finger(s): Secondary | ICD-10-CM | POA: Diagnosis not present

## 2018-04-30 DIAGNOSIS — S61112A Laceration without foreign body of left thumb with damage to nail, initial encounter: Secondary | ICD-10-CM | POA: Diagnosis not present

## 2018-04-30 DIAGNOSIS — S62522A Displaced fracture of distal phalanx of left thumb, initial encounter for closed fracture: Secondary | ICD-10-CM | POA: Diagnosis not present

## 2018-05-08 DIAGNOSIS — S62522A Displaced fracture of distal phalanx of left thumb, initial encounter for closed fracture: Secondary | ICD-10-CM | POA: Diagnosis not present

## 2018-05-22 DIAGNOSIS — S62522D Displaced fracture of distal phalanx of left thumb, subsequent encounter for fracture with routine healing: Secondary | ICD-10-CM | POA: Diagnosis not present

## 2018-05-22 DIAGNOSIS — S61112D Laceration without foreign body of left thumb with damage to nail, subsequent encounter: Secondary | ICD-10-CM | POA: Diagnosis not present

## 2018-06-13 DIAGNOSIS — S62522D Displaced fracture of distal phalanx of left thumb, subsequent encounter for fracture with routine healing: Secondary | ICD-10-CM | POA: Diagnosis not present

## 2018-10-01 DIAGNOSIS — I10 Essential (primary) hypertension: Secondary | ICD-10-CM | POA: Diagnosis not present

## 2018-10-01 DIAGNOSIS — F419 Anxiety disorder, unspecified: Secondary | ICD-10-CM | POA: Diagnosis not present

## 2018-10-01 DIAGNOSIS — Z87898 Personal history of other specified conditions: Secondary | ICD-10-CM | POA: Diagnosis not present

## 2018-10-01 DIAGNOSIS — R51 Headache: Secondary | ICD-10-CM | POA: Diagnosis not present

## 2019-02-18 DIAGNOSIS — R21 Rash and other nonspecific skin eruption: Secondary | ICD-10-CM | POA: Diagnosis not present

## 2019-03-19 DIAGNOSIS — Z0000 Encounter for general adult medical examination without abnormal findings: Secondary | ICD-10-CM | POA: Diagnosis not present

## 2019-03-19 DIAGNOSIS — I10 Essential (primary) hypertension: Secondary | ICD-10-CM | POA: Diagnosis not present

## 2019-03-19 DIAGNOSIS — Z125 Encounter for screening for malignant neoplasm of prostate: Secondary | ICD-10-CM | POA: Diagnosis not present

## 2019-03-19 DIAGNOSIS — R739 Hyperglycemia, unspecified: Secondary | ICD-10-CM | POA: Diagnosis not present

## 2019-03-19 DIAGNOSIS — E781 Pure hyperglyceridemia: Secondary | ICD-10-CM | POA: Diagnosis not present

## 2019-03-21 DIAGNOSIS — R82998 Other abnormal findings in urine: Secondary | ICD-10-CM | POA: Diagnosis not present

## 2019-03-21 DIAGNOSIS — I10 Essential (primary) hypertension: Secondary | ICD-10-CM | POA: Diagnosis not present

## 2019-03-25 DIAGNOSIS — Z1331 Encounter for screening for depression: Secondary | ICD-10-CM | POA: Diagnosis not present

## 2019-03-25 DIAGNOSIS — Z0000 Encounter for general adult medical examination without abnormal findings: Secondary | ICD-10-CM | POA: Diagnosis not present

## 2019-03-25 DIAGNOSIS — Z87898 Personal history of other specified conditions: Secondary | ICD-10-CM | POA: Diagnosis not present

## 2019-03-25 DIAGNOSIS — I517 Cardiomegaly: Secondary | ICD-10-CM | POA: Diagnosis not present

## 2019-03-25 DIAGNOSIS — M19049 Primary osteoarthritis, unspecified hand: Secondary | ICD-10-CM | POA: Diagnosis not present

## 2019-03-25 DIAGNOSIS — I10 Essential (primary) hypertension: Secondary | ICD-10-CM | POA: Diagnosis not present

## 2019-07-08 ENCOUNTER — Other Ambulatory Visit: Payer: Self-pay

## 2019-07-08 ENCOUNTER — Encounter (HOSPITAL_COMMUNITY): Payer: Self-pay | Admitting: Family Medicine

## 2019-07-08 ENCOUNTER — Ambulatory Visit (INDEPENDENT_AMBULATORY_CARE_PROVIDER_SITE_OTHER): Payer: BC Managed Care – PPO

## 2019-07-08 ENCOUNTER — Ambulatory Visit (HOSPITAL_COMMUNITY)
Admission: EM | Admit: 2019-07-08 | Discharge: 2019-07-08 | Disposition: A | Discharge status: Home / Self Care | Payer: BC Managed Care – PPO | Attending: Family Medicine | Admitting: Family Medicine

## 2019-07-08 ENCOUNTER — Ambulatory Visit (HOSPITAL_COMMUNITY): Payer: BC Managed Care – PPO

## 2019-07-08 DIAGNOSIS — S6992XA Unspecified injury of left wrist, hand and finger(s), initial encounter: Secondary | ICD-10-CM | POA: Diagnosis not present

## 2019-07-08 DIAGNOSIS — Z23 Encounter for immunization: Secondary | ICD-10-CM | POA: Diagnosis not present

## 2019-07-08 DIAGNOSIS — M79645 Pain in left finger(s): Secondary | ICD-10-CM | POA: Diagnosis not present

## 2019-07-08 MED ORDER — HYDROCODONE-ACETAMINOPHEN 5-325 MG PO TABS: 1.0000 | ORAL_TABLET | Freq: Four times a day (QID) | ORAL | 0 refills | Status: DC | PRN

## 2019-07-08 MED ORDER — TETANUS-DIPHTH-ACELL PERTUSSIS 5-2.5-18.5 LF-MCG/0.5 IM SUSP
0.5000 mL | Freq: Once | INTRAMUSCULAR | Status: AC
  Administered 2019-07-08: 0.5 mL via INTRAMUSCULAR

## 2019-07-08 MED ORDER — AMOXICILLIN-POT CLAVULANATE 875-125 MG PO TABS: 1.0000 | ORAL_TABLET | Freq: Two times a day (BID) | ORAL | 0 refills | Status: DC

## 2019-07-08 MED ORDER — TETANUS-DIPHTH-ACELL PERTUSSIS 5-2.5-18.5 LF-MCG/0.5 IM SUSP
INTRAMUSCULAR | Status: AC
  Filled 2019-07-08: qty 0.5

## 2019-07-08 NOTE — Discharge Instructions (Addendum)
No fractures on your x-ray. Treating you for infection with antibiotics and give you something for pain Keep finger clean and dry as possible Tetanus shot updated today. Given you contact for hand specialist if this problem continues or worsens.

## 2019-07-08 NOTE — ED Triage Notes (Signed)
Pt report Friday afternoon he injured his left finger on a wood splitter. Pt is nauseous from pain. Pt has been taking Excedrin for pain. Pt has been using Neosporin.    No fever, can not recall last Tetanus.

## 2019-07-08 NOTE — ED Provider Notes (Signed)
[facility]    CSN: [id number] Arrival date & time: 07/08/19  0909      History   Chief Complaint Chief Complaint  Patient presents with  . Finger Injury    HPI Gregory Walsh is a 54 y.o. male.   Patient is a 54 year old male who presents today with left finger injury.  Reporting 3 days ago at work he injured the left finger on a wood splitter.  Since he has been keeping clean, keeping the finger wrapped.  Normal range of motion of the finger.  He has been using Neosporin.  Taken NSAID for pain without much relief.  Reporting good sensation and denies any numbness or tingling.  He is concerned of infection due to open wound and the amount of pain he is feeling.  ROS per HPI      Past Medical History:  Diagnosis Date  . Anxiety    takes Xanax as needed  . Diverticulosis   . History of colonoscopy   . History of migraine    takes Maxalt daily as needed  . Hyperlipidemia    takes Atorvastatin daily  . Hypertension    takes Atenolol daily  . Muscle spasm    takes Flexeril daily as needed    Patient Active Problem List   Diagnosis Date Noted  . Distal radius fracture, right 06/20/2015    Past Surgical History:  Procedure Laterality Date  . CLOSED REDUCTION METACARPAL WITH PERCUTANEOUS PINNING Left 04/17/2018   Procedure: CLOSED REDUCTION LEFT THUMB DISTAL PHALYNX WITH PERCUTANEOUS PINNING AND NAILBED AND SKIN REPAIR;  Surgeon: Betha Loa, MD;  Location: [facility];  Service: Orthopedics;  Laterality: Left;  . COLONOSCOPY    . HERNIA REPAIR  80's  . INCISION AND DRAINAGE WOUND WITH NAILBED REPAIR Left 04/17/2018   Procedure: INCISION AND DRAINAGE WOUND WITH NAILBED REPAIR;  Surgeon: Betha Loa, MD;  Location: [facility];  Service: Orthopedics;  Laterality: Left;  . OPEN REDUCTION INTERNAL FIXATION (ORIF) DISTAL RADIAL FRACTURE Right 06/20/2015   Procedure: OPEN REDUCTION INTERNAL FIXATION (ORIF RIGHT ) DISTAL RADIAL FRACTURE  WITH REPAIRS ;  Surgeon: Dominica Severin, MD;  Location: [facility];  Service: Orthopedics;  Laterality: Right;  . WISDOM TOOTH EXTRACTION         Home Medications    Prior to Admission medications   Medication Sig Start Date End Date Taking? Authorizing Provider  atenolol (TENORMIN) 50 MG tablet Take 50 mg by mouth daily. 04/16/15  Yes [provider]  lisinopril (PRINIVIL,ZESTRIL) 10 MG tablet Take 10 mg by mouth daily.   Yes [provider]  metoprolol tartrate (LOPRESSOR) 100 MG tablet Take 100 mg by mouth 2 (two) times daily.   Yes [provider]  traZODone (DESYREL) 100 MG tablet Take 100 mg by mouth at bedtime.   Yes [provider]  venlafaxine (EFFEXOR) 25 MG tablet Take 25 mg by mouth 2 (two) times daily.   Yes [provider]  Acetaminophen-Caffeine (EXCEDRIN TENSION HEADACHE) 500-65 MG TABS Take 1 tablet by mouth 2 (two) times daily as needed (for headache).     [provider]  amoxicillin-clavulanate (AUGMENTIN) 875-125 MG tablet Take 1 tablet by mouth every 12 (twelve) hours. 07/08/19   Dahlia Byes A, NP  atorvastatin (LIPITOR) 10 MG tablet Take 10 mg by mouth daily. 03/28/15   [provider]  HYDROcodone-acetaminophen (NORCO/VICODIN) 5-325 MG tablet Take 1-2 tablets by mouth every 6 (six) hours as needed. 07/08/19  Dahlia Byes A, NP  Multiple Vitamin (MULTIVITAMIN) tablet Take 1 tablet by mouth daily.    [provider]  naltrexone (DEPADE) 50 MG tablet Take 50 mg by mouth daily.    [provider]  rizatriptan (MAXALT) 10 MG tablet Take 10 mg by mouth as needed for migraine (May repeat in 2 hours, if needed.).  06/15/15   [provider]    Family History Family History  Problem Relation Age of Onset  . Healthy Mother   . Healthy Father     Social History Social History   Tobacco Use  . Smoking status: Never Smoker  . Smokeless tobacco: Current User    Types: Snuff  Substance Use  Topics  . Alcohol use: Yes    Alcohol/week: 5.0 standard drinks    Types: 5 Cans of beer per week    Comment: hx of ETOH abuse  . Drug use: No     Allergies   Patient has no known allergies.   Review of Systems Review of Systems   Physical Exam Triage Vital Signs ED Triage Vitals  Enc Vitals Group     BP 07/08/19 0943 (!) 157/122     Pulse Rate 07/08/19 0943 97     Resp 07/08/19 0943 16     Temp 07/08/19 0943 98.2 F (36.8 C)     Temp Source 07/08/19 0943 Oral     SpO2 07/08/19 0943 100 %     Weight --      Height --      Head Circumference --      Peak Flow --      Pain Score 07/08/19 0951 7     Pain Loc --      Pain Edu? --      Excl. in GC? --    No data found.  Updated Vital Signs BP (!) 157/122 (BP Location: Right Arm)   Pulse 97   Temp 98.2 F (36.8 C) (Oral)   Resp 16   SpO2 100%   Visual Acuity Right Eye Distance:   Left Eye Distance:   Bilateral Distance:    Right Eye Near:   Left Eye Near:    Bilateral Near:     Physical Exam Vitals and nursing note reviewed.  Constitutional:      Appearance: Normal appearance.  HENT:     Head: Normocephalic and atraumatic.     Nose: Nose normal.  Eyes:     Conjunctiva/sclera: Conjunctivae normal.  Pulmonary:     Effort: Pulmonary effort is normal.  Musculoskeletal:        General: Normal range of motion.     Cervical back: Normal range of motion.     Comments: See picture for detail Sensation intact Normal range of motion of finger   Skin:    General: Skin is warm and dry.  Neurological:     Mental Status: He is alert.  Psychiatric:        Mood and Affect: Mood normal.          UC Treatments / Results  Labs (all labs ordered are listed, but only abnormal results are displayed) Labs Reviewed - No data to display  EKG   Radiology DG Finger Index Left  Result Date: 07/08/2019 CLINICAL DATA:  Distal left index finger pain after wood splitter injury 3 days ago. EXAM: LEFT INDEX  FINGER 2+V COMPARISON:  None. FINDINGS: Superficial soft tissue irregularity at the tip of the index finger. No radiopaque  foreign body identified. No acute fracture or dislocation. Joint spaces are preserved. Bone mineralization is normal. IMPRESSION: Superficial soft tissue irregularity at the tip of the index finger. No radiopaque foreign body or acute osseous abnormality. Electronically Signed   By: Titus Dubin M.D.   On: 07/08/2019 10:46    Procedures Procedures (including critical care time)  Medications Ordered in UC Medications  Tdap (BOOSTRIX) injection 0.5 mL (0.5 mLs Intramuscular Given 07/08/19 1027)    Initial Impression / Assessment and Plan / UC Course  I have reviewed the triage vital signs and the nursing notes.  Pertinent labs & imaging results that were available during my care of the patient were reviewed by me and considered in my medical decision making (see chart for details).     Finger injury Avulsion to left index finger No fractures on x-ray. Will uncover with Augmentin for infection and updated tetanus today. Wrapped with bulky dressing here in clinic Hydrocodone for pain as needed Contact given for hand specialist Dr. Amedeo Plenty for follow-up as needed Final Clinical Impressions(s) / UC Diagnoses   Final diagnoses:  Injury of finger of left hand, initial encounter     Discharge Instructions     No fractures on your x-ray. Treating you for infection with antibiotics and give you something for pain Keep finger clean and dry as possible Tetanus shot updated today. Given you contact for hand specialist if this problem continues or worsens.    ED Prescriptions    Medication Sig Dispense Auth. Provider   amoxicillin-clavulanate (AUGMENTIN) 875-125 MG tablet Take 1 tablet by mouth every 12 (twelve) hours. 14 tablet Chesni Vos A, NP   HYDROcodone-acetaminophen (NORCO/VICODIN) 5-325 MG tablet Take 1-2 tablets by mouth every 6 (six) hours as needed. 12  tablet Ulah Olmo A, NP     I have reviewed the PDMP during this encounter.   Loura Halt A, NP 07/08/19 1110

## 2019-08-27 DIAGNOSIS — I10 Essential (primary) hypertension: Secondary | ICD-10-CM | POA: Diagnosis not present

## 2019-08-27 DIAGNOSIS — R519 Headache, unspecified: Secondary | ICD-10-CM | POA: Diagnosis not present

## 2020-03-10 ENCOUNTER — Ambulatory Visit
Admission: EM | Admit: 2020-03-10 | Discharge: 2020-03-10 | Disposition: A | Discharge status: Home / Self Care | Payer: BC Managed Care – PPO | Attending: Emergency Medicine | Admitting: Emergency Medicine

## 2020-03-10 ENCOUNTER — Other Ambulatory Visit: Payer: Self-pay

## 2020-03-10 DIAGNOSIS — M545 Low back pain, unspecified: Secondary | ICD-10-CM

## 2020-03-10 MED ORDER — NAPROXEN 500 MG PO TABS: 500.0000 mg | ORAL_TABLET | Freq: Two times a day (BID) | ORAL | 0 refills | Status: AC

## 2020-03-10 MED ORDER — CYCLOBENZAPRINE HCL 5 MG PO TABS: 5.0000 mg | ORAL_TABLET | Freq: Two times a day (BID) | ORAL | 0 refills | Status: AC | PRN

## 2020-03-10 NOTE — ED Provider Notes (Signed)
[facility]    CSN: [id number] Arrival date & time: 03/10/20  0815      History   Chief Complaint Chief Complaint  Patient presents with  . Back Pain    HPI Gregory Walsh is a 55 y.o. male  presents for left low back pain since Saturday after moving a couch.  States pain is nonradiating, achy.  Has not tried anything for relief.  Denies trauma/injury to the affected area.  Denies fever, saddle area anesthesia, lower extremity numbness/weakness, urinary retention, fecal incontinence.  Past Medical History:  Diagnosis Date  . Anxiety    takes Xanax as needed  . Diverticulosis   . History of colonoscopy   . History of migraine    takes Maxalt daily as needed  . Hyperlipidemia    takes Atorvastatin daily  . Hypertension    takes Atenolol daily  . Muscle spasm    takes Flexeril daily as needed    Patient Active Problem List   Diagnosis Date Noted  . Distal radius fracture, right 06/20/2015    Past Surgical History:  Procedure Laterality Date  . CLOSED REDUCTION METACARPAL WITH PERCUTANEOUS PINNING Left 04/17/2018   Procedure: CLOSED REDUCTION LEFT THUMB DISTAL PHALYNX WITH PERCUTANEOUS PINNING AND NAILBED AND SKIN REPAIR;  Surgeon: Betha Loa, MD;  Location: [facility];  Service: Orthopedics;  Laterality: Left;  . COLONOSCOPY    . HERNIA REPAIR  80's  . INCISION AND DRAINAGE WOUND WITH NAILBED REPAIR Left 04/17/2018   Procedure: INCISION AND DRAINAGE WOUND WITH NAILBED REPAIR;  Surgeon: Betha Loa, MD;  Location: [facility];  Service: Orthopedics;  Laterality: Left;  . OPEN REDUCTION INTERNAL FIXATION (ORIF) DISTAL RADIAL FRACTURE Right 06/20/2015   Procedure: OPEN REDUCTION INTERNAL FIXATION (ORIF RIGHT ) DISTAL RADIAL FRACTURE WITH REPAIRS ;  Surgeon: Dominica Severin, MD;  Location: [facility];  Service: Orthopedics;  Laterality: Right;  . WISDOM TOOTH EXTRACTION         Home Medications    Prior to Admission medications    Medication Sig Start Date End Date Taking? Authorizing Provider  cyclobenzaprine (FLEXERIL) 5 MG tablet Take 1 tablet (5 mg total) by mouth 2 (two) times daily as needed for up to 7 days for muscle spasms. 03/10/20 03/17/20 Yes Hall-Potvin, [place], PA-C  naproxen (NAPROSYN) 500 MG tablet Take 1 tablet (500 mg total) by mouth 2 (two) times daily. 03/10/20  Yes Hall-Potvin, [place], PA-C  Acetaminophen-Caffeine (EXCEDRIN TENSION HEADACHE) 500-65 MG TABS Take 1 tablet by mouth 2 (two) times daily as needed (for headache).     [provider]  atenolol (TENORMIN) 50 MG tablet Take 50 mg by mouth daily. 04/16/15   [provider]  atorvastatin (LIPITOR) 10 MG tablet Take 10 mg by mouth daily. 03/28/15   [provider]  lisinopril (PRINIVIL,ZESTRIL) 10 MG tablet Take 10 mg by mouth daily.    [provider]  metoprolol tartrate (LOPRESSOR) 100 MG tablet Take 100 mg by mouth 2 (two) times daily.    [provider]  Multiple Vitamin (MULTIVITAMIN) tablet Take 1 tablet by mouth daily.    [provider]  naltrexone (DEPADE) 50 MG tablet Take 50 mg by mouth daily.    [provider]  rizatriptan (MAXALT) 10 MG tablet Take 10 mg by mouth as needed for migraine (May repeat in 2 hours, if needed.).  06/15/15   [provider]  traZODone (DESYREL) 100 MG tablet Take 100 mg by mouth at  bedtime.    [provider]  venlafaxine (EFFEXOR) 25 MG tablet Take 25 mg by mouth 2 (two) times daily.    [provider]    Family History Family History  Problem Relation Age of Onset  . Healthy Mother   . Healthy Father     Social History Social History   Tobacco Use  . Smoking status: Never Smoker  . Smokeless tobacco: Current User    Types: Snuff  Substance Use Topics  . Alcohol use: Yes    Alcohol/week: 5.0 standard drinks    Types: 5 Cans of beer per week    Comment: hx of ETOH abuse  . Drug use: No     Allergies    Patient has no known allergies.   Review of Systems Review of Systems  Constitutional: Negative for fatigue and fever.  Respiratory: Negative for cough and shortness of breath.   Cardiovascular: Negative for chest pain and palpitations.  Gastrointestinal: Negative for abdominal pain, diarrhea and vomiting.  Musculoskeletal: Positive for back pain. Negative for arthralgias and myalgias.  Skin: Negative for rash and wound.  Neurological: Negative for speech difficulty and headaches.  All other systems reviewed and are negative.    Physical Exam Triage Vital Signs ED Triage Vitals  Enc Vitals Group     BP 03/10/20 0824 (!) 183/119     Pulse Rate 03/10/20 0824 65     Resp 03/10/20 0824 18     Temp 03/10/20 0824 98 F (36.7 C)     Temp Source 03/10/20 0824 Oral     SpO2 03/10/20 0824 93 %     Weight --      Height --      Head Circumference --      Peak Flow --      Pain Score 03/10/20 0825 7     Pain Loc --      Pain Edu? --      Excl. in GC? --    No data found.  Updated Vital Signs BP (!) 183/119 (BP Location: Left Arm)   Pulse 65   Temp 98 F (36.7 C) (Oral)   Resp 18   SpO2 93%   Visual Acuity Right Eye Distance:   Left Eye Distance:   Bilateral Distance:    Right Eye Near:   Left Eye Near:    Bilateral Near:     Physical Exam Constitutional:      General: He is not in acute distress. HENT:     Head: Normocephalic and atraumatic.  Eyes:     General: No scleral icterus.    Pupils: Pupils are equal, round, and reactive to light.  Cardiovascular:     Rate and Rhythm: Normal rate.  Pulmonary:     Effort: Pulmonary effort is normal. No respiratory distress.     Breath sounds: No wheezing.  Musculoskeletal:        General: No swelling or tenderness. Normal range of motion.     Comments: Lumbar region unremarkable  Skin:    Coloration: Skin is not jaundiced or pale.  Neurological:     Mental Status: He is alert and oriented to person, place, and  time.      UC Treatments / Results  Labs (all labs ordered are listed, but only abnormal results are displayed) Labs Reviewed - No data to display  EKG   Radiology No results found.  Procedures Procedures (including critical care time)  Medications Ordered in UC Medications -  No data to display  Initial Impression / Assessment and Plan / UC Course  I have reviewed the triage vital signs and the nursing notes.  Pertinent labs & imaging results that were available during my care of the patient were reviewed by me and considered in my medical decision making (see chart for details).     Patient appears well in office today.  MSK strain due to moving couch.  No alarm signs/symptoms as mentioned in HPI.  Patient is hypertensive: Reports compliance with antihypertensives, though has not taken it today.  Followed by PCP and will follow up with them for further evaluation management as needed.  Return precautions discussed, pt verbalized understanding and is agreeable to plan. Final Clinical Impressions(s) / UC Diagnoses   Final diagnoses:  Acute left-sided low back pain without sciatica     Discharge Instructions     RICE: rest, ice, compression, elevation as needed for pain.    Heat therapy (hot compress, warm wash rag, hot showers, etc.) can help relax muscles and soothe muscle aches. Cold therapy (ice packs) can be used to help swelling both after injury and after prolonged use of areas of chronic pain/aches.  Pain medication:  500 mg Naprosyn/Aleve (naproxen) every 12 hours with food:  AVOID other NSAIDs while taking this (may have Tylenol).  Important to follow up with specialist(s) below for further evaluation/management if your symptoms persist or worsen.    ED Prescriptions    Medication Sig Dispense Auth. Provider   cyclobenzaprine (FLEXERIL) 5 MG tablet Take 1 tablet (5 mg total) by mouth 2 (two) times daily as needed for up to 7 days for muscle spasms. 14  tablet Hall-Potvin, [place], PA-C   naproxen (NAPROSYN) 500 MG tablet Take 1 tablet (500 mg total) by mouth 2 (two) times daily. 30 tablet Hall-Potvin, [place], PA-C     I have reviewed the PDMP during this encounter.   Odette Fraction [place], [place] 03/10/20 [phone]

## 2020-03-10 NOTE — ED Triage Notes (Signed)
Pt c/o lower back pain since Saturday after moving a cough. States needs a work note. Denies pain radiating.

## 2020-03-10 NOTE — Discharge Instructions (Addendum)
RICE: rest, ice, compression, elevation as needed for pain.    Heat therapy (hot compress, warm wash rag, hot showers, etc.) can help relax muscles and soothe muscle aches. Cold therapy (ice packs) can be used to help swelling both after injury and after prolonged use of areas of chronic pain/aches.  Pain medication:  500 mg Naprosyn/Aleve (naproxen) every 12 hours with food:  AVOID other NSAIDs while taking this (may have Tylenol).  Important to follow up with specialist(s) below for further evaluation/management if your symptoms persist or worsen. 

## 2020-05-12 DIAGNOSIS — Z20828 Contact with and (suspected) exposure to other viral communicable diseases: Secondary | ICD-10-CM | POA: Diagnosis not present

## 2020-05-12 DIAGNOSIS — I10 Essential (primary) hypertension: Secondary | ICD-10-CM | POA: Diagnosis not present

## 2020-06-29 DIAGNOSIS — S0501XA Injury of conjunctiva and corneal abrasion without foreign body, right eye, initial encounter: Secondary | ICD-10-CM | POA: Diagnosis not present

## 2020-09-27 DIAGNOSIS — F1020 Alcohol dependence, uncomplicated: Secondary | ICD-10-CM | POA: Diagnosis not present

## 2020-09-28 DIAGNOSIS — F1020 Alcohol dependence, uncomplicated: Secondary | ICD-10-CM | POA: Diagnosis not present

## 2020-09-29 DIAGNOSIS — F1020 Alcohol dependence, uncomplicated: Secondary | ICD-10-CM | POA: Diagnosis not present

## 2020-09-30 DIAGNOSIS — F1020 Alcohol dependence, uncomplicated: Secondary | ICD-10-CM | POA: Diagnosis not present

## 2020-10-01 DIAGNOSIS — F1020 Alcohol dependence, uncomplicated: Secondary | ICD-10-CM | POA: Diagnosis not present

## 2020-10-02 DIAGNOSIS — F1020 Alcohol dependence, uncomplicated: Secondary | ICD-10-CM | POA: Diagnosis not present

## 2020-10-03 DIAGNOSIS — F1020 Alcohol dependence, uncomplicated: Secondary | ICD-10-CM | POA: Diagnosis not present

## 2020-10-04 DIAGNOSIS — F5101 Primary insomnia: Secondary | ICD-10-CM | POA: Diagnosis not present

## 2020-10-04 DIAGNOSIS — F411 Generalized anxiety disorder: Secondary | ICD-10-CM | POA: Diagnosis not present

## 2020-10-04 DIAGNOSIS — F329 Major depressive disorder, single episode, unspecified: Secondary | ICD-10-CM | POA: Diagnosis not present

## 2020-10-04 DIAGNOSIS — F331 Major depressive disorder, recurrent, moderate: Secondary | ICD-10-CM | POA: Diagnosis not present

## 2020-10-04 DIAGNOSIS — F1020 Alcohol dependence, uncomplicated: Secondary | ICD-10-CM | POA: Diagnosis not present

## 2020-10-04 DIAGNOSIS — F1120 Opioid dependence, uncomplicated: Secondary | ICD-10-CM | POA: Diagnosis not present

## 2020-11-02 DIAGNOSIS — F411 Generalized anxiety disorder: Secondary | ICD-10-CM | POA: Diagnosis not present

## 2020-11-02 DIAGNOSIS — F1120 Opioid dependence, uncomplicated: Secondary | ICD-10-CM | POA: Diagnosis not present

## 2020-11-02 DIAGNOSIS — F331 Major depressive disorder, recurrent, moderate: Secondary | ICD-10-CM | POA: Diagnosis not present

## 2020-11-02 DIAGNOSIS — F1020 Alcohol dependence, uncomplicated: Secondary | ICD-10-CM | POA: Diagnosis not present

## 2020-11-02 DIAGNOSIS — F5101 Primary insomnia: Secondary | ICD-10-CM | POA: Diagnosis not present

## 2020-11-03 DIAGNOSIS — F1120 Opioid dependence, uncomplicated: Secondary | ICD-10-CM | POA: Diagnosis not present

## 2020-11-03 DIAGNOSIS — F331 Major depressive disorder, recurrent, moderate: Secondary | ICD-10-CM | POA: Diagnosis not present

## 2020-11-03 DIAGNOSIS — F411 Generalized anxiety disorder: Secondary | ICD-10-CM | POA: Diagnosis not present

## 2020-11-03 DIAGNOSIS — F5101 Primary insomnia: Secondary | ICD-10-CM | POA: Diagnosis not present

## 2020-11-03 DIAGNOSIS — F1020 Alcohol dependence, uncomplicated: Secondary | ICD-10-CM | POA: Diagnosis not present

## 2020-11-04 DIAGNOSIS — F1120 Opioid dependence, uncomplicated: Secondary | ICD-10-CM | POA: Diagnosis not present

## 2020-11-04 DIAGNOSIS — F5101 Primary insomnia: Secondary | ICD-10-CM | POA: Diagnosis not present

## 2020-11-04 DIAGNOSIS — F411 Generalized anxiety disorder: Secondary | ICD-10-CM | POA: Diagnosis not present

## 2020-11-04 DIAGNOSIS — F331 Major depressive disorder, recurrent, moderate: Secondary | ICD-10-CM | POA: Diagnosis not present

## 2020-11-04 DIAGNOSIS — F1020 Alcohol dependence, uncomplicated: Secondary | ICD-10-CM | POA: Diagnosis not present

## 2020-11-05 DIAGNOSIS — F5101 Primary insomnia: Secondary | ICD-10-CM | POA: Diagnosis not present

## 2020-11-05 DIAGNOSIS — F331 Major depressive disorder, recurrent, moderate: Secondary | ICD-10-CM | POA: Diagnosis not present

## 2020-11-05 DIAGNOSIS — F1020 Alcohol dependence, uncomplicated: Secondary | ICD-10-CM | POA: Diagnosis not present

## 2020-11-05 DIAGNOSIS — F1120 Opioid dependence, uncomplicated: Secondary | ICD-10-CM | POA: Diagnosis not present

## 2020-11-05 DIAGNOSIS — F411 Generalized anxiety disorder: Secondary | ICD-10-CM | POA: Diagnosis not present

## 2020-11-06 DIAGNOSIS — F1120 Opioid dependence, uncomplicated: Secondary | ICD-10-CM | POA: Diagnosis not present

## 2020-11-06 DIAGNOSIS — F411 Generalized anxiety disorder: Secondary | ICD-10-CM | POA: Diagnosis not present

## 2020-11-06 DIAGNOSIS — F1020 Alcohol dependence, uncomplicated: Secondary | ICD-10-CM | POA: Diagnosis not present

## 2020-11-06 DIAGNOSIS — F331 Major depressive disorder, recurrent, moderate: Secondary | ICD-10-CM | POA: Diagnosis not present

## 2020-11-06 DIAGNOSIS — F5101 Primary insomnia: Secondary | ICD-10-CM | POA: Diagnosis not present

## 2020-11-09 DIAGNOSIS — F5101 Primary insomnia: Secondary | ICD-10-CM | POA: Diagnosis not present

## 2020-11-09 DIAGNOSIS — F331 Major depressive disorder, recurrent, moderate: Secondary | ICD-10-CM | POA: Diagnosis not present

## 2020-11-09 DIAGNOSIS — F411 Generalized anxiety disorder: Secondary | ICD-10-CM | POA: Diagnosis not present

## 2020-11-09 DIAGNOSIS — F1120 Opioid dependence, uncomplicated: Secondary | ICD-10-CM | POA: Diagnosis not present

## 2020-11-09 DIAGNOSIS — F1020 Alcohol dependence, uncomplicated: Secondary | ICD-10-CM | POA: Diagnosis not present

## 2020-11-10 DIAGNOSIS — F1120 Opioid dependence, uncomplicated: Secondary | ICD-10-CM | POA: Diagnosis not present

## 2020-11-10 DIAGNOSIS — F1020 Alcohol dependence, uncomplicated: Secondary | ICD-10-CM | POA: Diagnosis not present

## 2020-11-10 DIAGNOSIS — F331 Major depressive disorder, recurrent, moderate: Secondary | ICD-10-CM | POA: Diagnosis not present

## 2020-11-10 DIAGNOSIS — F411 Generalized anxiety disorder: Secondary | ICD-10-CM | POA: Diagnosis not present

## 2020-11-10 DIAGNOSIS — F5101 Primary insomnia: Secondary | ICD-10-CM | POA: Diagnosis not present

## 2020-11-11 DIAGNOSIS — F411 Generalized anxiety disorder: Secondary | ICD-10-CM | POA: Diagnosis not present

## 2020-11-11 DIAGNOSIS — F1020 Alcohol dependence, uncomplicated: Secondary | ICD-10-CM | POA: Diagnosis not present

## 2020-11-11 DIAGNOSIS — F331 Major depressive disorder, recurrent, moderate: Secondary | ICD-10-CM | POA: Diagnosis not present

## 2020-11-11 DIAGNOSIS — F1120 Opioid dependence, uncomplicated: Secondary | ICD-10-CM | POA: Diagnosis not present

## 2020-11-11 DIAGNOSIS — F5101 Primary insomnia: Secondary | ICD-10-CM | POA: Diagnosis not present

## 2020-11-12 DIAGNOSIS — F1020 Alcohol dependence, uncomplicated: Secondary | ICD-10-CM | POA: Diagnosis not present

## 2020-11-12 DIAGNOSIS — F1120 Opioid dependence, uncomplicated: Secondary | ICD-10-CM | POA: Diagnosis not present

## 2020-11-12 DIAGNOSIS — F411 Generalized anxiety disorder: Secondary | ICD-10-CM | POA: Diagnosis not present

## 2020-11-12 DIAGNOSIS — F5101 Primary insomnia: Secondary | ICD-10-CM | POA: Diagnosis not present

## 2020-11-12 DIAGNOSIS — F331 Major depressive disorder, recurrent, moderate: Secondary | ICD-10-CM | POA: Diagnosis not present

## 2020-11-18 DIAGNOSIS — F1020 Alcohol dependence, uncomplicated: Secondary | ICD-10-CM | POA: Diagnosis not present

## 2020-11-20 DIAGNOSIS — F1020 Alcohol dependence, uncomplicated: Secondary | ICD-10-CM | POA: Diagnosis not present

## 2020-11-23 DIAGNOSIS — F1020 Alcohol dependence, uncomplicated: Secondary | ICD-10-CM | POA: Diagnosis not present

## 2020-11-24 DIAGNOSIS — F1020 Alcohol dependence, uncomplicated: Secondary | ICD-10-CM | POA: Diagnosis not present

## 2020-11-25 DIAGNOSIS — F1020 Alcohol dependence, uncomplicated: Secondary | ICD-10-CM | POA: Diagnosis not present

## 2020-11-27 DIAGNOSIS — F1020 Alcohol dependence, uncomplicated: Secondary | ICD-10-CM | POA: Diagnosis not present

## 2020-11-30 DIAGNOSIS — F1020 Alcohol dependence, uncomplicated: Secondary | ICD-10-CM | POA: Diagnosis not present

## 2020-12-01 DIAGNOSIS — F1020 Alcohol dependence, uncomplicated: Secondary | ICD-10-CM | POA: Diagnosis not present

## 2020-12-01 DIAGNOSIS — F330 Major depressive disorder, recurrent, mild: Secondary | ICD-10-CM | POA: Diagnosis not present

## 2020-12-02 DIAGNOSIS — F1020 Alcohol dependence, uncomplicated: Secondary | ICD-10-CM | POA: Diagnosis not present

## 2020-12-04 DIAGNOSIS — F330 Major depressive disorder, recurrent, mild: Secondary | ICD-10-CM | POA: Diagnosis not present

## 2020-12-04 DIAGNOSIS — F1020 Alcohol dependence, uncomplicated: Secondary | ICD-10-CM | POA: Diagnosis not present

## 2020-12-07 DIAGNOSIS — E785 Hyperlipidemia, unspecified: Secondary | ICD-10-CM | POA: Diagnosis not present

## 2020-12-07 DIAGNOSIS — R739 Hyperglycemia, unspecified: Secondary | ICD-10-CM | POA: Diagnosis not present

## 2020-12-07 DIAGNOSIS — F1020 Alcohol dependence, uncomplicated: Secondary | ICD-10-CM | POA: Diagnosis not present

## 2020-12-07 DIAGNOSIS — F330 Major depressive disorder, recurrent, mild: Secondary | ICD-10-CM | POA: Diagnosis not present

## 2020-12-07 DIAGNOSIS — Z125 Encounter for screening for malignant neoplasm of prostate: Secondary | ICD-10-CM | POA: Diagnosis not present

## 2020-12-08 DIAGNOSIS — K5732 Diverticulitis of large intestine without perforation or abscess without bleeding: Secondary | ICD-10-CM | POA: Diagnosis not present

## 2020-12-08 DIAGNOSIS — F1020 Alcohol dependence, uncomplicated: Secondary | ICD-10-CM | POA: Diagnosis not present

## 2020-12-08 DIAGNOSIS — R1032 Left lower quadrant pain: Secondary | ICD-10-CM | POA: Diagnosis not present

## 2020-12-08 DIAGNOSIS — R1031 Right lower quadrant pain: Secondary | ICD-10-CM | POA: Diagnosis not present

## 2020-12-09 DIAGNOSIS — F330 Major depressive disorder, recurrent, mild: Secondary | ICD-10-CM | POA: Diagnosis not present

## 2020-12-09 DIAGNOSIS — F1020 Alcohol dependence, uncomplicated: Secondary | ICD-10-CM | POA: Diagnosis not present

## 2020-12-11 DIAGNOSIS — F1020 Alcohol dependence, uncomplicated: Secondary | ICD-10-CM | POA: Diagnosis not present

## 2020-12-14 DIAGNOSIS — E781 Pure hyperglyceridemia: Secondary | ICD-10-CM | POA: Diagnosis not present

## 2020-12-14 DIAGNOSIS — F330 Major depressive disorder, recurrent, mild: Secondary | ICD-10-CM | POA: Diagnosis not present

## 2020-12-14 DIAGNOSIS — I10 Essential (primary) hypertension: Secondary | ICD-10-CM | POA: Diagnosis not present

## 2020-12-14 DIAGNOSIS — R82998 Other abnormal findings in urine: Secondary | ICD-10-CM | POA: Diagnosis not present

## 2020-12-14 DIAGNOSIS — Z0000 Encounter for general adult medical examination without abnormal findings: Secondary | ICD-10-CM | POA: Diagnosis not present

## 2020-12-14 DIAGNOSIS — F1020 Alcohol dependence, uncomplicated: Secondary | ICD-10-CM | POA: Diagnosis not present

## 2020-12-15 DIAGNOSIS — F1020 Alcohol dependence, uncomplicated: Secondary | ICD-10-CM | POA: Diagnosis not present

## 2020-12-16 DIAGNOSIS — F330 Major depressive disorder, recurrent, mild: Secondary | ICD-10-CM | POA: Diagnosis not present

## 2020-12-16 DIAGNOSIS — F1020 Alcohol dependence, uncomplicated: Secondary | ICD-10-CM | POA: Diagnosis not present

## 2020-12-18 DIAGNOSIS — F330 Major depressive disorder, recurrent, mild: Secondary | ICD-10-CM | POA: Diagnosis not present

## 2020-12-18 DIAGNOSIS — F1020 Alcohol dependence, uncomplicated: Secondary | ICD-10-CM | POA: Diagnosis not present

## 2020-12-21 DIAGNOSIS — F330 Major depressive disorder, recurrent, mild: Secondary | ICD-10-CM | POA: Diagnosis not present

## 2020-12-21 DIAGNOSIS — F1020 Alcohol dependence, uncomplicated: Secondary | ICD-10-CM | POA: Diagnosis not present

## 2020-12-22 DIAGNOSIS — F1020 Alcohol dependence, uncomplicated: Secondary | ICD-10-CM | POA: Diagnosis not present

## 2020-12-23 DIAGNOSIS — F1020 Alcohol dependence, uncomplicated: Secondary | ICD-10-CM | POA: Diagnosis not present

## 2020-12-25 DIAGNOSIS — F1020 Alcohol dependence, uncomplicated: Secondary | ICD-10-CM | POA: Diagnosis not present

## 2020-12-25 DIAGNOSIS — F330 Major depressive disorder, recurrent, mild: Secondary | ICD-10-CM | POA: Diagnosis not present

## 2020-12-28 DIAGNOSIS — F330 Major depressive disorder, recurrent, mild: Secondary | ICD-10-CM | POA: Diagnosis not present

## 2020-12-28 DIAGNOSIS — F1020 Alcohol dependence, uncomplicated: Secondary | ICD-10-CM | POA: Diagnosis not present

## 2020-12-29 DIAGNOSIS — F1020 Alcohol dependence, uncomplicated: Secondary | ICD-10-CM | POA: Diagnosis not present

## 2020-12-30 DIAGNOSIS — F1020 Alcohol dependence, uncomplicated: Secondary | ICD-10-CM | POA: Diagnosis not present

## 2020-12-30 DIAGNOSIS — F330 Major depressive disorder, recurrent, mild: Secondary | ICD-10-CM | POA: Diagnosis not present

## 2021-01-01 DIAGNOSIS — F1020 Alcohol dependence, uncomplicated: Secondary | ICD-10-CM | POA: Diagnosis not present

## 2021-01-01 DIAGNOSIS — F330 Major depressive disorder, recurrent, mild: Secondary | ICD-10-CM | POA: Diagnosis not present

## 2021-01-06 DIAGNOSIS — F1020 Alcohol dependence, uncomplicated: Secondary | ICD-10-CM | POA: Diagnosis not present

## 2021-01-11 DIAGNOSIS — F1020 Alcohol dependence, uncomplicated: Secondary | ICD-10-CM | POA: Diagnosis not present

## 2021-01-15 DIAGNOSIS — F330 Major depressive disorder, recurrent, mild: Secondary | ICD-10-CM | POA: Diagnosis not present

## 2021-01-15 DIAGNOSIS — F1020 Alcohol dependence, uncomplicated: Secondary | ICD-10-CM | POA: Diagnosis not present

## 2021-01-18 DIAGNOSIS — F330 Major depressive disorder, recurrent, mild: Secondary | ICD-10-CM | POA: Diagnosis not present

## 2021-01-18 DIAGNOSIS — F1020 Alcohol dependence, uncomplicated: Secondary | ICD-10-CM | POA: Diagnosis not present

## 2021-03-15 ENCOUNTER — Encounter: Payer: Self-pay | Admitting: Emergency Medicine

## 2021-03-15 ENCOUNTER — Other Ambulatory Visit: Payer: Self-pay

## 2021-03-15 ENCOUNTER — Ambulatory Visit
Admission: EM | Admit: 2021-03-15 | Discharge: 2021-03-15 | Disposition: A | Discharge status: Home / Self Care | Payer: BC Managed Care – PPO | Attending: Internal Medicine | Admitting: Internal Medicine

## 2021-03-15 DIAGNOSIS — J111 Influenza due to unidentified influenza virus with other respiratory manifestations: Secondary | ICD-10-CM | POA: Diagnosis not present

## 2021-03-15 DIAGNOSIS — R42 Dizziness and giddiness: Secondary | ICD-10-CM | POA: Diagnosis not present

## 2021-03-15 LAB — POCT INFLUENZA A/B
Influenza A, POC: NEGATIVE
Influenza B, POC: NEGATIVE

## 2021-03-15 MED ORDER — ONDANSETRON 4 MG PO TBDP
4.0000 mg | ORAL_TABLET | Freq: Once | ORAL | Status: AC
  Administered 2021-03-15: 4 mg via ORAL

## 2021-03-15 NOTE — ED Provider Notes (Signed)
[facility]    CSN: [id number] Arrival date & time: 03/15/21  1055      History   Chief Complaint Chief Complaint  Patient presents with   Nausea   Emesis   Generalized Body Aches    HPI Gregory Walsh is a 56 y.o. male.   Pleasant 56 year old male presents today with acute complaint of body aches, nausea, headache starting last evening.  He reports mild dizziness/lightheadedness this morning as well.  He did vomit 1 time.  He works part-time at [employer] and states several coworkers have been ill.  He was given a Zofran in the waiting room and states his nausea is resolved.  He admits to not eating this morning, and only had a cup of coffee.  He denies any water intake.  He denies a history of dizziness or any neurological symptoms.  He did not take any over-the-counter medications for symptoms.   Emesis  Past Medical History:  Diagnosis Date   Anxiety    takes Xanax as needed   Diverticulosis    History of colonoscopy    History of migraine    takes Maxalt daily as needed   Hyperlipidemia    takes Atorvastatin daily   Hypertension    takes Atenolol daily   Muscle spasm    takes Flexeril daily as needed    Patient Active Problem List   Diagnosis Date Noted   Distal radius fracture, right 06/20/2015    Past Surgical History:  Procedure Laterality Date   CLOSED REDUCTION METACARPAL WITH PERCUTANEOUS PINNING Left 04/17/2018   Procedure: CLOSED REDUCTION LEFT THUMB DISTAL PHALYNX WITH PERCUTANEOUS PINNING AND NAILBED AND SKIN REPAIR;  Surgeon: Betha Loa, MD;  Location: [facility];  Service: Orthopedics;  Laterality: Left;   COLONOSCOPY     HERNIA REPAIR  80's   INCISION AND DRAINAGE WOUND WITH NAILBED REPAIR Left 04/17/2018   Procedure: INCISION AND DRAINAGE WOUND WITH NAILBED REPAIR;  Surgeon: Betha Loa, MD;  Location: [facility];  Service: Orthopedics;  Laterality: Left;   OPEN REDUCTION INTERNAL FIXATION (ORIF) DISTAL  RADIAL FRACTURE Right 06/20/2015   Procedure: OPEN REDUCTION INTERNAL FIXATION (ORIF RIGHT ) DISTAL RADIAL FRACTURE WITH REPAIRS ;  Surgeon: Dominica Severin, MD;  Location: [facility];  Service: Orthopedics;  Laterality: Right;   WISDOM TOOTH EXTRACTION         Home Medications    Prior to Admission medications   Medication Sig Start Date End Date Taking? Authorizing Provider  Acetaminophen-Caffeine (EXCEDRIN TENSION HEADACHE) 500-65 MG TABS Take 1 tablet by mouth 2 (two) times daily as needed (for headache).     [provider]  atenolol (TENORMIN) 50 MG tablet Take 50 mg by mouth daily. 04/16/15   [provider]  atorvastatin (LIPITOR) 10 MG tablet Take 10 mg by mouth daily. 03/28/15   [provider]  lisinopril (PRINIVIL,ZESTRIL) 10 MG tablet Take 10 mg by mouth daily.    [provider]  metoprolol tartrate (LOPRESSOR) 100 MG tablet Take 100 mg by mouth 2 (two) times daily.    [provider]  Multiple Vitamin (MULTIVITAMIN) tablet Take 1 tablet by mouth daily.    [provider]  naltrexone (DEPADE) 50 MG tablet Take 50 mg by mouth daily.    [provider]  naproxen (NAPROSYN) 500 MG tablet Take 1 tablet (500 mg total) by mouth 2 (two) times daily. 03/10/20   Hall-Potvin, [place], PA-C  rizatriptan (MAXALT) 10 MG tablet Take  10 mg by mouth as needed for migraine (May repeat in 2 hours, if needed.).  06/15/15   [provider]  traZODone (DESYREL) 100 MG tablet Take 100 mg by mouth at bedtime.    [provider]  venlafaxine (EFFEXOR) 25 MG tablet Take 25 mg by mouth 2 (two) times daily.    [provider]    Family History Family History  Problem Relation Age of Onset   Healthy Mother    Healthy Father     Social History Social History   Tobacco Use   Smoking status: Never   Smokeless tobacco: Current    Types: Snuff  Substance Use Topics   Alcohol use: Yes    Alcohol/week: 5.0 standard  drinks    Types: 5 Cans of beer per week    Comment: hx of ETOH abuse   Drug use: No     Allergies   Patient has no known allergies.   Review of Systems Review of Systems  Gastrointestinal:  Positive for vomiting.  As per hpi  Physical Exam Triage Vital Signs ED Triage Vitals  Enc Vitals Group     BP 03/15/21 1138 (!) 138/91     Pulse Rate 03/15/21 1138 84     Resp 03/15/21 1138 16     Temp 03/15/21 1138 97.9 F (36.6 C)     Temp Source 03/15/21 1138 Oral     SpO2 03/15/21 1138 97 %     Weight --      Height --      Head Circumference --      Peak Flow --      Pain Score 03/15/21 1139 7     Pain Loc --      Pain Edu? --      Excl. in GC? --    No data found.  Updated Vital Signs BP (!) 138/91 (BP Location: Left Arm)    Pulse 84    Temp 97.9 F (36.6 C) (Oral)    Resp 16    SpO2 97%   Visual Acuity Right Eye Distance:   Left Eye Distance:   Bilateral Distance:    Right Eye Near:   Left Eye Near:    Bilateral Near:     Physical Exam Vitals and nursing note reviewed.  Constitutional:      General: He is not in acute distress.    Appearance: Normal appearance. He is obese. He is not ill-appearing, toxic-appearing or diaphoretic.  HENT:     Head: Normocephalic and atraumatic.     Right Ear: Tympanic membrane, ear canal and external ear normal. No drainage, swelling or tenderness. No middle ear effusion. There is no impacted cerumen. Tympanic membrane is not erythematous.     Left Ear: Tympanic membrane, ear canal and external ear normal. No drainage, swelling or tenderness.  No middle ear effusion. There is no impacted cerumen. Tympanic membrane is not erythematous.     Nose: Nose normal. No congestion or rhinorrhea.     Mouth/Throat:     Mouth: Mucous membranes are moist. No oral lesions.     Pharynx: Oropharynx is clear. No pharyngeal swelling, oropharyngeal exudate, posterior oropharyngeal erythema or uvula swelling.     Tonsils: No tonsillar exudate or  tonsillar abscesses.  Eyes:     General: No scleral icterus.       Right eye: No discharge.        Left eye: No discharge.     Extraocular Movements: Extraocular  movements intact.     Left eye: Normal extraocular motion.     Conjunctiva/sclera: Conjunctivae normal.     Pupils: Pupils are equal, round, and reactive to light.  Neck:     Thyroid: No thyromegaly.  Cardiovascular:     Rate and Rhythm: Normal rate and regular rhythm.     Heart sounds: Normal heart sounds. No murmur heard.   No friction rub. No gallop.  Pulmonary:     Effort: Pulmonary effort is normal. No respiratory distress.     Breath sounds: Normal breath sounds. No stridor. No wheezing, rhonchi or rales.  Chest:     Chest wall: No tenderness.  Abdominal:     General: Abdomen is flat. Bowel sounds are normal. There is no distension.     Palpations: Abdomen is soft.     Tenderness: There is no abdominal tenderness. There is no right CVA tenderness, left CVA tenderness, guarding or rebound.  Musculoskeletal:        General: No swelling. Normal range of motion.     Cervical back: Normal range of motion. No rigidity or tenderness.  Lymphadenopathy:     Cervical: No cervical adenopathy.  Skin:    General: Skin is warm.     Capillary Refill: Capillary refill takes less than 2 seconds.     Coloration: Skin is not pale.     Findings: No erythema or rash.  Neurological:     General: No focal deficit present.     Mental Status: He is alert and oriented to person, place, and time.     Sensory: No sensory deficit.     Motor: No weakness.  Psychiatric:        Mood and Affect: Mood normal.        Behavior: Behavior normal.     UC Treatments / Results  Labs (all labs ordered are listed, but only abnormal results are displayed) Labs Reviewed  POCT INFLUENZA A/B    EKG   Radiology No results found.  Procedures Procedures (including critical care time)  Medications Ordered in UC Medications  ondansetron  (ZOFRAN-ODT) disintegrating tablet 4 mg (4 mg Oral Given 03/15/21 1146)    Initial Impression / Assessment and Plan / UC Course  I have reviewed the triage vital signs and the nursing notes.  Pertinent labs & imaging results that were available during my care of the patient were reviewed by me and considered in my medical decision making (see chart for details).     Flu-like illness -possible false negative since symptoms started roughly 12 hours ago.  Patient refuses COVID test.  Supportive measures.  Work note provided. May alternate ibuprofen with tylenol, try OTC oscillococcinum for body aches. Dizziness -neuro exam normal in office.  Suspect this is due to only having diuretics this morning, and no water intake despite viral illness.  Does not appear dehydrated on exam.  Final Clinical Impressions(s) / UC Diagnoses   Final diagnoses:  Influenza-like illness  Dizziness and giddiness     Discharge Instructions      Your flu test was negative in our office. Your symptoms sound consistent with a viral URI. Please rest and hydrate. Cut back on coffees, sodas, teas or alcohol as this will further dehydrate you. Re-hydrate with water, pedialyte, gatorade. Out of work for 2 days.     ED Prescriptions   None    PDMP not reviewed this encounter.   Maretta Bees, [place] 03/15/21 1338

## 2021-03-15 NOTE — Discharge Instructions (Addendum)
Your flu test was negative in our office. Your symptoms sound consistent with a viral URI. Please rest and hydrate. Cut back on coffees, sodas, teas or alcohol as this will further dehydrate you. Re-hydrate with water, pedialyte, gatorade. Out of work for 2 days.

## 2021-03-15 NOTE — ED Triage Notes (Addendum)
C/o nausea, dizziness, vomiting, headache, generalized body aches, nasal congestion all starting yesterday. Denies difficulty with speech, memory, gait, CVA/TIA hx, CP, SOB.

## 2021-06-07 ENCOUNTER — Ambulatory Visit
Admission: EM | Admit: 2021-06-07 | Discharge: 2021-06-07 | Disposition: A | Discharge status: Home / Self Care | Payer: Managed Care, Other (non HMO) | Attending: Urgent Care | Admitting: Urgent Care

## 2021-06-07 ENCOUNTER — Ambulatory Visit (INDEPENDENT_AMBULATORY_CARE_PROVIDER_SITE_OTHER): Payer: Managed Care, Other (non HMO)

## 2021-06-07 DIAGNOSIS — R059 Cough, unspecified: Secondary | ICD-10-CM

## 2021-06-07 DIAGNOSIS — R052 Subacute cough: Secondary | ICD-10-CM | POA: Diagnosis not present

## 2021-06-07 DIAGNOSIS — R0981 Nasal congestion: Secondary | ICD-10-CM

## 2021-06-07 DIAGNOSIS — R062 Wheezing: Secondary | ICD-10-CM | POA: Diagnosis not present

## 2021-06-07 DIAGNOSIS — J069 Acute upper respiratory infection, unspecified: Secondary | ICD-10-CM | POA: Diagnosis not present

## 2021-06-07 MED ORDER — BENZONATATE 100 MG PO CAPS: 100.0000 mg | ORAL_CAPSULE | Freq: Three times a day (TID) | ORAL | 0 refills | Status: AC | PRN

## 2021-06-07 MED ORDER — PSEUDOEPHEDRINE HCL 60 MG PO TABS: 60.0000 mg | ORAL_TABLET | Freq: Three times a day (TID) | ORAL | 0 refills | Status: AC | PRN

## 2021-06-07 MED ORDER — LEVOCETIRIZINE DIHYDROCHLORIDE 5 MG PO TABS: 5.0000 mg | ORAL_TABLET | Freq: Every evening | ORAL | 0 refills | Status: AC

## 2021-06-07 MED ORDER — PROMETHAZINE-DM 6.25-15 MG/5ML PO SYRP: 5.0000 mL | ORAL_SOLUTION | Freq: Every evening | ORAL | 0 refills | Status: AC | PRN

## 2021-06-07 NOTE — ED Provider Notes (Signed)
?[place] ? ? ?MRN: [id number] DOB: 1965-10-04 ? ?Subjective:  ? ?Gregory Walsh is a 56 y.o. male presenting for 3-day history of acute onset coughing, congestion, sinus pressure, body aches, wheezing and chest congestion.  No history of asthma.  No history of smoking.  Patient has been using Mucinex and Excedrin. ? ?No current facility-administered medications for this encounter. ? ?Current Outpatient Medications:  ?  Acetaminophen-Caffeine (EXCEDRIN TENSION HEADACHE) 500-65 MG TABS, Take 1 tablet by mouth 2 (two) times daily as needed (for headache). , Disp: , Rfl:  ?  atenolol (TENORMIN) 50 MG tablet, Take 50 mg by mouth daily., Disp: , Rfl: 2 ?  atorvastatin (LIPITOR) 10 MG tablet, Take 10 mg by mouth daily., Disp: , Rfl: 6 ?  lisinopril (PRINIVIL,ZESTRIL) 10 MG tablet, Take 10 mg by mouth daily., Disp: , Rfl:  ?  metoprolol tartrate (LOPRESSOR) 100 MG tablet, Take 100 mg by mouth 2 (two) times daily., Disp: , Rfl:  ?  Multiple Vitamin (MULTIVITAMIN) tablet, Take 1 tablet by mouth daily., Disp: , Rfl:  ?  naltrexone (DEPADE) 50 MG tablet, Take 50 mg by mouth daily., Disp: , Rfl:  ?  naproxen (NAPROSYN) 500 MG tablet, Take 1 tablet (500 mg total) by mouth 2 (two) times daily., Disp: 30 tablet, Rfl: 0 ?  rizatriptan (MAXALT) 10 MG tablet, Take 10 mg by mouth as needed for migraine (May repeat in 2 hours, if needed.). , Disp: , Rfl:  ?  traZODone (DESYREL) 100 MG tablet, Take 100 mg by mouth at bedtime., Disp: , Rfl:  ?  venlafaxine (EFFEXOR) 25 MG tablet, Take 25 mg by mouth 2 (two) times daily., Disp: , Rfl:   ? ?No Known Allergies ? ?Past Medical History:  ?Diagnosis Date  ? Anxiety   ? takes Xanax as needed  ? Diverticulosis   ? History of colonoscopy   ? History of migraine   ? takes Maxalt daily as needed  ? Hyperlipidemia   ? takes Atorvastatin daily  ? Hypertension   ? takes Atenolol daily  ? Muscle spasm   ? takes Flexeril daily as needed  ?  ? ?Past Surgical History:  ?Procedure  Laterality Date  ? CLOSED REDUCTION METACARPAL WITH PERCUTANEOUS PINNING Left 04/17/2018  ? Procedure: CLOSED REDUCTION LEFT THUMB DISTAL PHALYNX WITH PERCUTANEOUS PINNING AND NAILBED AND SKIN REPAIR;  Surgeon: Leanora Cover, MD;  Location: [facility];  Service: Orthopedics;  Laterality: Left;  ? COLONOSCOPY    ? HERNIA REPAIR  80's  ? INCISION AND DRAINAGE WOUND WITH NAILBED REPAIR Left 04/17/2018  ? Procedure: INCISION AND DRAINAGE WOUND WITH NAILBED REPAIR;  Surgeon: Leanora Cover, MD;  Location: [facility];  Service: Orthopedics;  Laterality: Left;  ? OPEN REDUCTION INTERNAL FIXATION (ORIF) DISTAL RADIAL FRACTURE Right 06/20/2015  ? Procedure: OPEN REDUCTION INTERNAL FIXATION (ORIF RIGHT ) DISTAL RADIAL FRACTURE WITH REPAIRS ;  Surgeon: Roseanne Kaufman, MD;  Location: [facility];  Service: Orthopedics;  Laterality: Right;  ? WISDOM TOOTH EXTRACTION    ? ? ?Family History  ?Problem Relation Age of Onset  ? Healthy Mother   ? Healthy Father   ? ? ?Social History  ? ?Tobacco Use  ? Smoking status: Never  ? Smokeless tobacco: Current  ?  Types: Snuff  ?Substance Use Topics  ? Alcohol use: Yes  ?  Alcohol/week: 5.0 standard drinks  ?  Types: 5 Cans of beer per week  ?  Comment: hx of ETOH abuse  ?  Drug use: No  ? ? ?ROS ? ? ?Objective:  ? ?Vitals: ?BP 132/87 (BP Location: Right Arm)   Pulse 79   Temp 98 ?F (36.7 ?C) (Oral)   Resp 17   SpO2 94%  ? ?Physical Exam ?Constitutional:   ?   General: He is not in acute distress. ?   Appearance: Normal appearance. He is well-developed and normal weight. He is not ill-appearing, toxic-appearing or diaphoretic.  ?HENT:  ?   Head: Normocephalic and atraumatic.  ?   Right Ear: Tympanic membrane, ear canal and external ear normal. There is no impacted cerumen.  ?   Left Ear: Tympanic membrane, ear canal and external ear normal. There is no impacted cerumen.  ?   Nose: Congestion present. No rhinorrhea.  ?   Mouth/Throat:  ?   Mouth: Mucous membranes are moist.   ?   Pharynx: No oropharyngeal exudate or posterior oropharyngeal erythema.  ?   Comments: Thick streaks of postnasal drainage overlying pharynx. ?Eyes:  ?   General: No scleral icterus.    ?   Right eye: No discharge.     ?   Left eye: No discharge.  ?   Extraocular Movements: Extraocular movements intact.  ?   Conjunctiva/sclera: Conjunctivae normal.  ?Cardiovascular:  ?   Rate and Rhythm: Normal rate and regular rhythm.  ?   Heart sounds: Normal heart sounds. No murmur heard. ?  No friction rub. No gallop.  ?Pulmonary:  ?   Effort: Pulmonary effort is normal. No respiratory distress.  ?   Breath sounds: Normal breath sounds. No stridor. No wheezing, rhonchi or rales.  ?Musculoskeletal:  ?   Cervical back: Normal range of motion and neck supple. No rigidity. No muscular tenderness.  ?Neurological:  ?   General: No focal deficit present.  ?   Mental Status: He is alert and oriented to person, place, and time.  ?Psychiatric:     ?   Mood and Affect: Mood normal.     ?   Behavior: Behavior normal.     ?   Thought Content: Thought content normal.  ? ?DG Chest 2 View ? ?Result Date: 06/07/2021 ?CLINICAL DATA:  cough, wheezing EXAM: CHEST - 2 VIEW COMPARISON:  06/19/2006 chest radiograph. FINDINGS: Stable cardiomediastinal silhouette with normal heart size. No pneumothorax. No pleural effusion. Lungs appear clear, with no acute consolidative airspace disease and no pulmonary edema. IMPRESSION: No active cardiopulmonary disease. Electronically Signed   By: Ilona Sorrel M.D.   On: 06/07/2021 10:33   ? ? ?Assessment and Plan :  ? ?PDMP not reviewed this encounter. ? ?1. Viral upper respiratory infection   ?2. Subacute cough   ?3. Sinus congestion   ? ? ?Chest x-ray negative.  Offered a COVID test but patient declined.  I am in agreement.  Reassured patient about not having a lower respiratory infection.  Recommend supportive care for viral upper respiratory infection.  Given timeline of illness, discussed antibiotic  stewardship, will hold off on antibiotics for now. Counseled patient on potential for adverse effects with medications prescribed/recommended today, ER and return-to-clinic precautions discussed, patient verbalized understanding. ? ?  ?Jaynee Eagles, PA-C ?06/07/21 1042 ? ?

## 2021-06-07 NOTE — ED Triage Notes (Signed)
3 day h/o cough, congestion, sinus pressure, body aches and intermittent low grade fever. ?Confirms diarrhea. No emesis. ?Has been taking mucinex and excedrin. ?

## 2022-06-01 IMAGING — DX DG CHEST 2V
2 series · 2 of 2 positions shown · non-contrast
Comparison: 06/19/2006 chest radiograph.

CLINICAL DATA: cough, wheezing

EXAM:
CHEST - 2 VIEW

[chest pa]
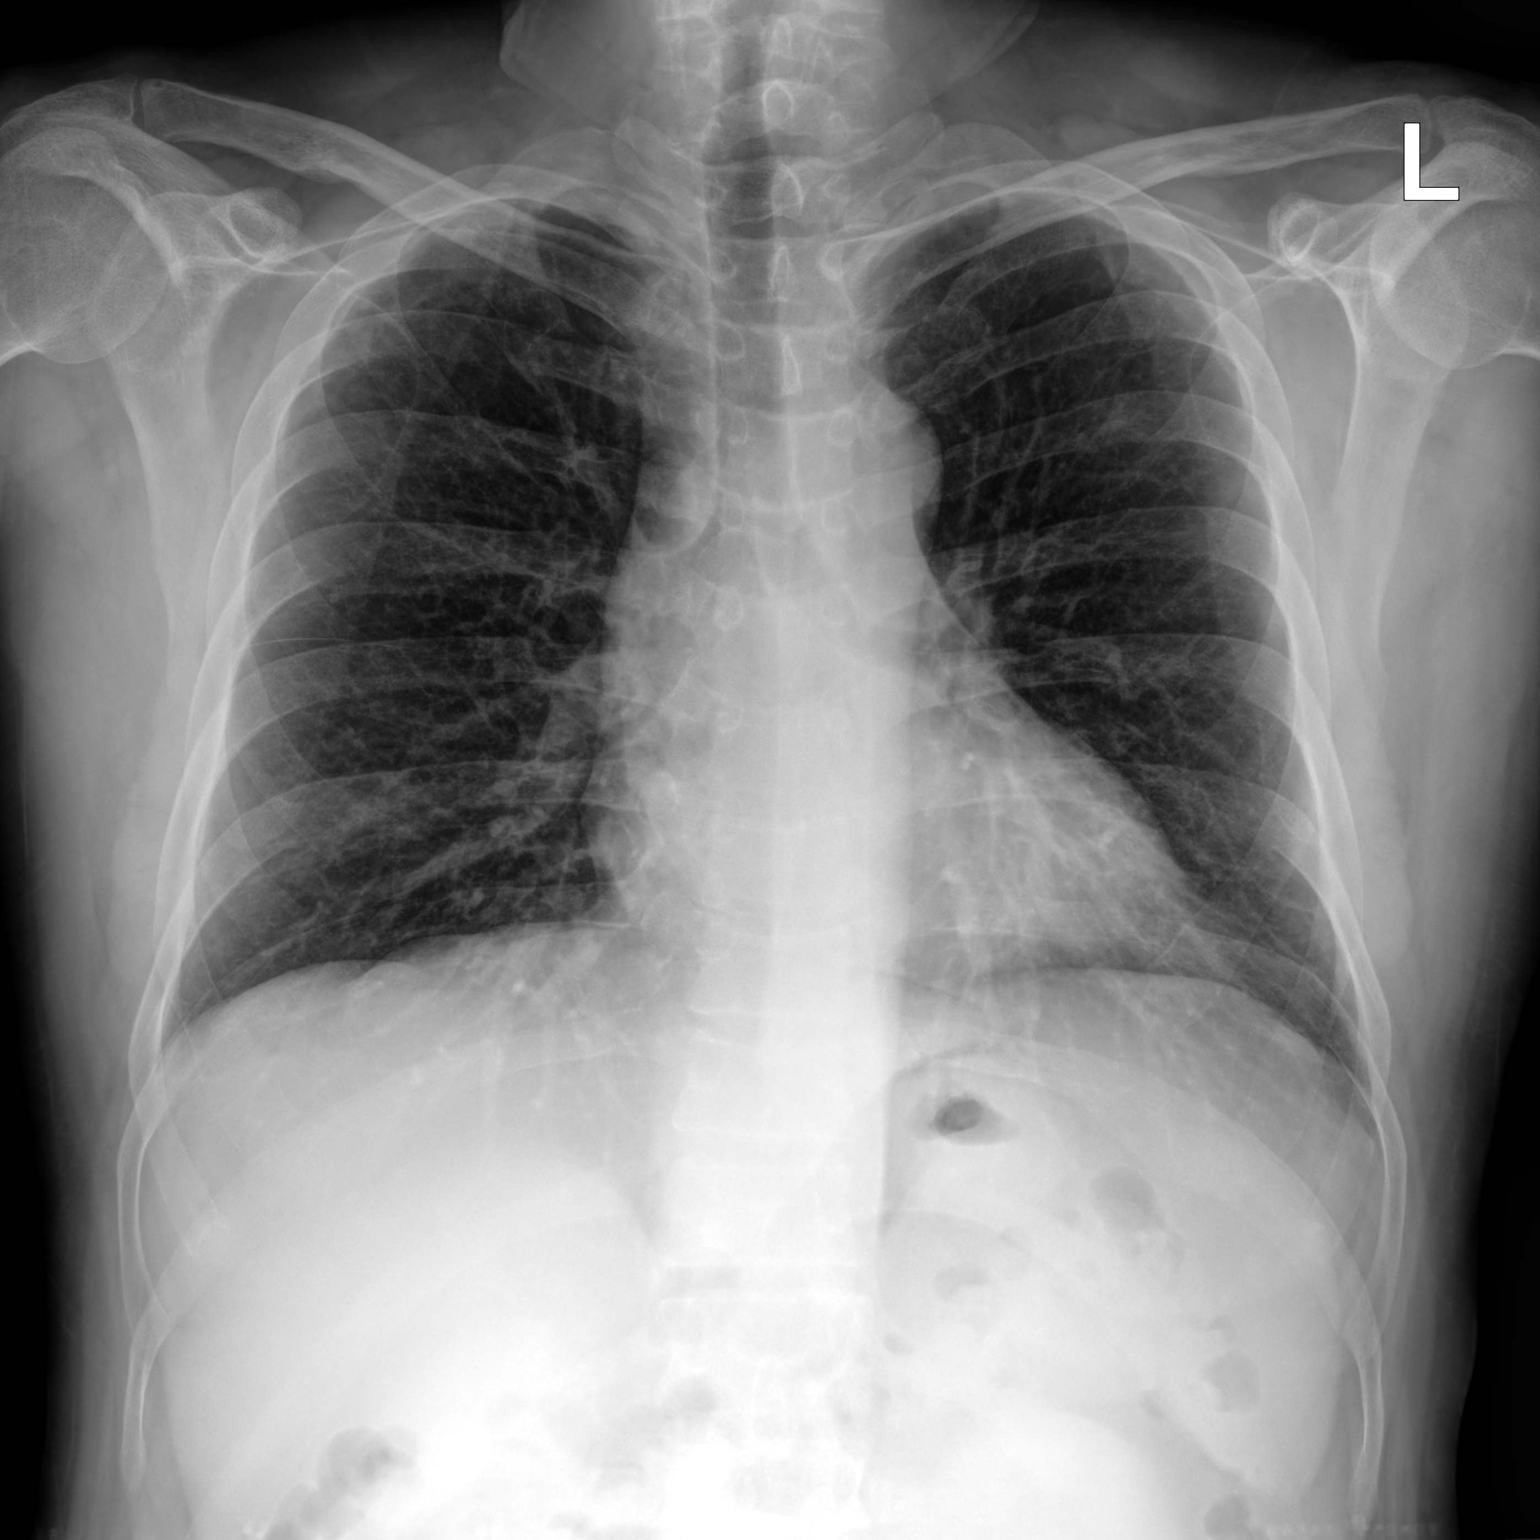

[chest lat]
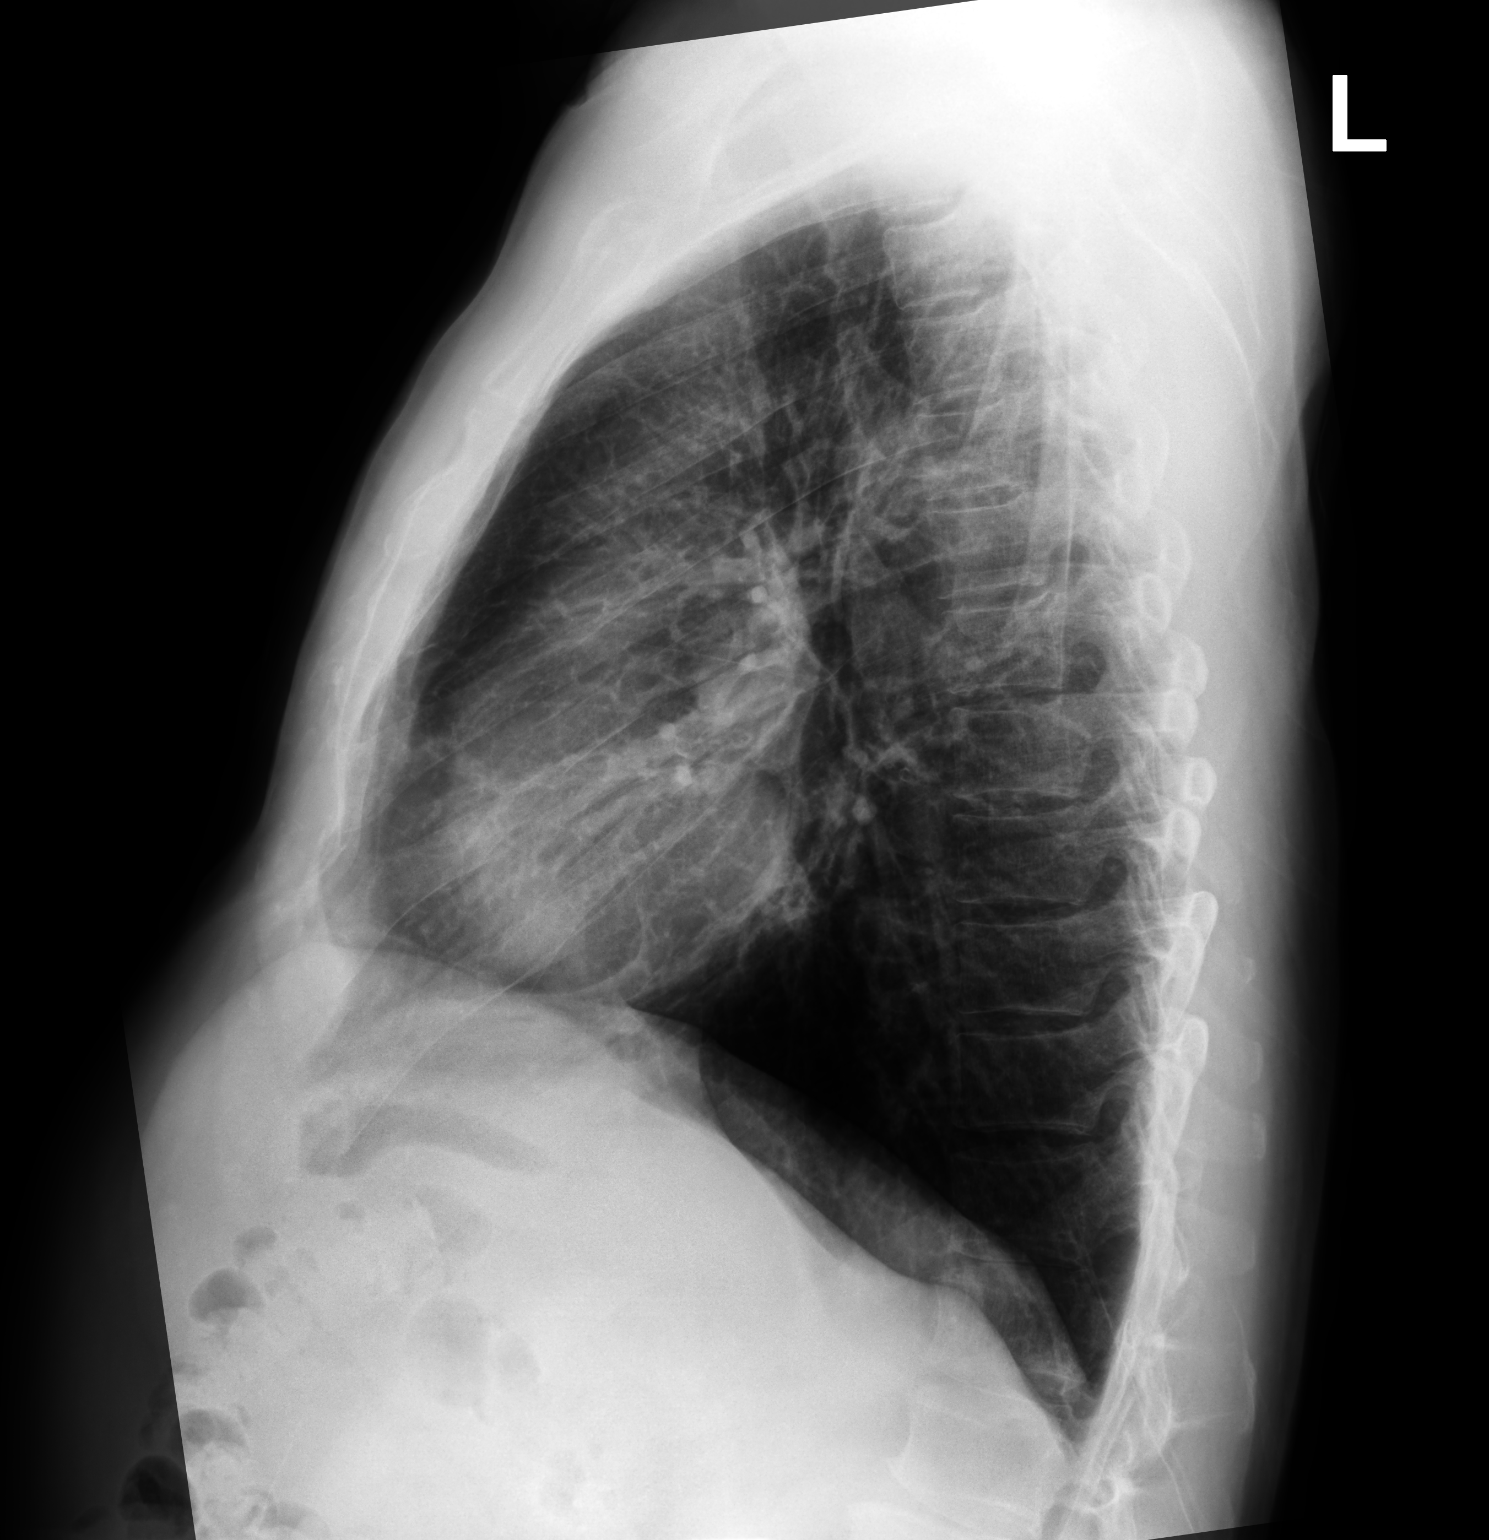

[2 of 2 positions shown; findings below may reference images not displayed]

FINDINGS: Stable cardiomediastinal silhouette with normal heart size. No
pneumothorax. No pleural effusion. Lungs appear clear, with no acute
consolidative airspace disease and no pulmonary edema.
IMPRESSION: No active cardiopulmonary disease.

## 2023-04-18 ENCOUNTER — Encounter (HOSPITAL_BASED_OUTPATIENT_CLINIC_OR_DEPARTMENT_OTHER): Payer: Self-pay | Admitting: Emergency Medicine

## 2023-04-18 ENCOUNTER — Emergency Department (HOSPITAL_BASED_OUTPATIENT_CLINIC_OR_DEPARTMENT_OTHER): Admitting: Radiology

## 2023-04-18 ENCOUNTER — Encounter: Payer: Self-pay | Admitting: Emergency Medicine

## 2023-04-18 ENCOUNTER — Ambulatory Visit: Admission: EM | Admit: 2023-04-18 | Discharge: 2023-04-18 | Disposition: A | Discharge status: Home / Self Care

## 2023-04-18 ENCOUNTER — Emergency Department (HOSPITAL_BASED_OUTPATIENT_CLINIC_OR_DEPARTMENT_OTHER)
Admission: EM | Admit: 2023-04-18 | Discharge: 2023-04-18 | Disposition: A | Discharge status: Home / Self Care | Attending: Emergency Medicine | Admitting: Emergency Medicine

## 2023-04-18 ENCOUNTER — Other Ambulatory Visit: Payer: Self-pay

## 2023-04-18 DIAGNOSIS — I10 Essential (primary) hypertension: Secondary | ICD-10-CM | POA: Insufficient documentation

## 2023-04-18 DIAGNOSIS — Z79899 Other long term (current) drug therapy: Secondary | ICD-10-CM | POA: Diagnosis not present

## 2023-04-18 DIAGNOSIS — Y990 Civilian activity done for income or pay: Secondary | ICD-10-CM | POA: Diagnosis not present

## 2023-04-18 DIAGNOSIS — W232XXA Caught, crushed, jammed or pinched between a moving and stationary object, initial encounter: Secondary | ICD-10-CM | POA: Insufficient documentation

## 2023-04-18 DIAGNOSIS — S60942A Unspecified superficial injury of right middle finger, initial encounter: Secondary | ICD-10-CM | POA: Diagnosis present

## 2023-04-18 DIAGNOSIS — S61212A Laceration without foreign body of right middle finger without damage to nail, initial encounter: Secondary | ICD-10-CM | POA: Insufficient documentation

## 2023-04-18 MED ORDER — LIDOCAINE HCL (PF) 1 % IJ SOLN
10.0000 mL | Freq: Once | INTRAMUSCULAR | Status: AC
  Administered 2023-04-18: 10 mL
  Filled 2023-04-18: qty 10

## 2023-04-18 NOTE — ED Provider Triage Note (Signed)
 Emergency Medicine Provider Triage Evaluation Note  Gregory Walsh , a 58 y.o. male  was evaluated in triage.  Pt complains of laceration to the volar surface of right middle finger.  Reports this occurred at work, trapped between forklift and wall.  States last tetanus update 2021.  Went to urgent care but was referred to ED due to "complexity".  Review of Systems  Positive:  Negative:   Physical Exam  There were no vitals taken for this visit. Gen:   Awake, no distress   Resp:  Normal effort  MSK:   Moves extremities without difficulty  Other:    Medical Decision Making  Medically screening exam initiated at 2:45 PM.  Appropriate orders placed.  Gregory Walsh was informed that the remainder of the evaluation will be completed by another provider, this initial triage assessment does not replace that evaluation, and the importance of remaining in the ED until their evaluation is complete.     Al Decant, PA-C 04/18/23 1445

## 2023-04-18 NOTE — ED Triage Notes (Signed)
 Pt presents with laceration on right hand middle finger/index finger region. Pt states the incident occurred this morning.

## 2023-04-18 NOTE — ED Provider Notes (Signed)
 [place] EMERGENCY DEPARTMENT AT [facility] Provider Note   CSN: [id number] Arrival date & time: 04/18/23  1438     History  No chief complaint on file.   Gregory Walsh is a 58 y.o. male.  With history of hypertension, hyperlipidemia, anxiety presenting to the ED for evaluation of laceration to the right long finger.  He states that he got his hand stuck between a forklift and a wall earlier this morning at work.  Tetanus up-to-date.  No numbness, weakness or tingling.  He is not anticoagulated.  Was initially seen at urgent care but referred to the emergency department due to complexity of his wound.  HPI     Home Medications Prior to Admission medications   Medication Sig Start Date End Date Taking? Authorizing Provider  Acetaminophen-Caffeine (EXCEDRIN TENSION HEADACHE) 500-65 MG TABS Take 1 tablet by mouth 2 (two) times daily as needed (for headache).     [provider]  atenolol (TENORMIN) 50 MG tablet Take 50 mg by mouth daily. 04/16/15   [provider]  atorvastatin (LIPITOR) 10 MG tablet Take 10 mg by mouth daily. 03/28/15   [provider]  benzonatate (TESSALON) 100 MG capsule Take 1-2 capsules (100-200 mg total) by mouth 3 (three) times daily as needed for cough. 06/07/21   Wallis Bamberg, PA-C  levocetirizine (XYZAL) 5 MG tablet Take 1 tablet (5 mg total) by mouth every evening. 06/07/21   Wallis Bamberg, PA-C  lisinopril (PRINIVIL,ZESTRIL) 10 MG tablet Take 10 mg by mouth daily.    [provider]  metoprolol tartrate (LOPRESSOR) 100 MG tablet Take 100 mg by mouth 2 (two) times daily.    [provider]  Multiple Vitamin (MULTIVITAMIN) tablet Take 1 tablet by mouth daily.    [provider]  naltrexone (DEPADE) 50 MG tablet Take 50 mg by mouth daily.    [provider]  naproxen (NAPROSYN) 500 MG tablet Take 1 tablet (500 mg total) by mouth 2 (two) times daily. 03/10/20   Hall-Potvin, [place], PA-C   promethazine-dextromethorphan (PROMETHAZINE-DM) 6.25-15 MG/5ML syrup Take 5 mLs by mouth at bedtime as needed for cough. 06/07/21   Wallis Bamberg, PA-C  pseudoephedrine (SUDAFED) 60 MG tablet Take 1 tablet (60 mg total) by mouth every 8 (eight) hours as needed for congestion. 06/07/21   Wallis Bamberg, PA-C  rizatriptan (MAXALT) 10 MG tablet Take 10 mg by mouth as needed for migraine (May repeat in 2 hours, if needed.).  06/15/15   [provider]  traZODone (DESYREL) 100 MG tablet Take 100 mg by mouth at bedtime.    [provider]  venlafaxine (EFFEXOR) 25 MG tablet Take 25 mg by mouth 2 (two) times daily.    [provider]      Allergies    Patient has no known allergies.    Review of Systems   Review of Systems  Skin:  Positive for wound.  All other systems reviewed and are negative.   Physical Exam Updated Vital Signs BP 118/74   Pulse 68   Temp 98 F (36.7 C)   Resp 18   SpO2 98%  Physical Exam Vitals and nursing note reviewed.  Constitutional:      General: He is not in acute distress.    Appearance: Normal appearance. He is normal weight. He is not ill-appearing.  HENT:     Head: Normocephalic and atraumatic.  Pulmonary:     Effort: Pulmonary effort is normal. No respiratory distress.  Abdominal:  General: Abdomen is flat.  Musculoskeletal:        General: Normal range of motion.     Cervical back: Neck supple.     Comments: Capillary refill normal.  Radial pulse 2+.  Sensation intact distally.  Full AROM.  Skin:    General: Skin is warm and dry.     Comments: 4 cm laceration to palmar surface of right long finger near the MCP joint.  No active bleeding.  Neurological:     Mental Status: He is alert and oriented to person, place, and time.  Psychiatric:        Mood and Affect: Mood normal.        Behavior: Behavior normal.     ED Results / Procedures / Treatments   Labs (all labs ordered are listed, but only abnormal results are  displayed) Labs Reviewed - No data to display  EKG None  Radiology DG Hand Complete Right Result Date: 04/18/2023 CLINICAL DATA:  Laceration to middle finger of right hand. Was crushed between forklift and wall. Volar right middle finger laceration. EXAM: RIGHT HAND - COMPLETE 3+ VIEW COMPARISON:  Right wrist radiographs 06/15/2015 FINDINGS: Interval volar plate and screw fixation of the previously seen comminuted distal radial intra-articular fracture. The fracture has healed. Nonunited now chronic base of ulnar styloid fracture, previously acute. Small ossicle overlying the posterior carpals consistent with the prior remote, nondisplaced dorsal triquetral fracture. No acute fracture or dislocation. There is bandage material overlying the proximal lateral aspect of the third finger. No radiopaque foreign body is seen. IMPRESSION: 1. No acute fracture or dislocation. 2. Interval volar plate and screw fixation of the previously seen comminuted distal radial intra-articular fracture. The fracture has healed. 3. Nonunited now chronic base of ulnar styloid fracture, previously acute. Electronically Signed   By: Neita Garnet M.D.   On: 04/18/2023 17:06    Procedures .Laceration Repair  Date/Time: 04/18/2023 6:14 PM  Performed by: Michelle Piper, PA-C Authorized by: Michelle Piper, PA-C   Consent:    Consent obtained:  Verbal   Consent given by:  Patient   Risks discussed:  Infection, pain, nerve damage, poor cosmetic result and poor wound healing   Alternatives discussed:  No treatment Universal protocol:    Procedure explained and questions answered to patient or proxy's satisfaction: yes     Relevant documents present and verified: yes     Test results available: yes     Imaging studies available: yes     Required blood products, implants, devices, and special equipment available: yes     Immediately prior to procedure, a time out was called: yes     Patient identity confirmed:   Verbally with patient and arm band Anesthesia:    Anesthesia method:  Local infiltration   Local anesthetic:  Lidocaine 1% w/o epi Laceration details:    Location:  Finger   Finger location:  R long finger   Length (cm):  4 Pre-procedure details:    Preparation:  Imaging obtained to evaluate for foreign bodies Exploration:    Imaging obtained: x-ray     Imaging outcome: foreign body not noted   Treatment:    Area cleansed with:  Povidone-iodine   Amount of cleaning:  Standard   Irrigation solution:  Sterile saline   Irrigation volume:  500 Skin repair:    Repair method:  Sutures   Suture size:  5-0   Suture material:  Prolene   Number of sutures:  5 (2  horizontal mattress, 3 simple interrupted) Repair type:    Repair type:  Simple Post-procedure details:    Dressing:  Non-adherent dressing   Procedure completion:  Tolerated well, no immediate complications     Medications Ordered in ED Medications  lidocaine (PF) (XYLOCAINE) 1 % injection 10 mL (has no administration in time range)    ED Course/ Medical Decision Making/ A&P                                 Medical Decision Making Risk Prescription drug management.  This patient presents to the ED for concern of finger laceration, this involves an extensive number of treatment options  My initial workup includes imaging, wound cleansing and closure  Additional history obtained from: Nursing notes from this visit.  I ordered imaging studies including x-ray right hand I independently visualized and interpreted imaging which showed no acute osseous abnormalities I agree with the radiologist interpretation  58 year old male presenting to the ED for evaluation of laceration to right long finger.  This occurred this morning.  Neurovascular status intact.  X-ray negative for acute osseous abnormalities or retained foreign bodies.  Wound was cleansed and closed in the emergency department.  Tetanus is up-to-date.  Patient  was educated on wound care and timeline for suture removal.  He was given return precautions.  Stable at discharge.  At this time there does not appear to be any evidence of an acute emergency medical condition and the patient appears stable for discharge with appropriate outpatient follow up. Diagnosis was discussed with patient who verbalizes understanding of care plan and is agreeable to discharge. I have discussed return precautions with patient who verbalizes understanding. Patient encouraged to follow-up with their PCP within 1 week. All questions answered.   Note: Portions of this report may have been transcribed using voice recognition software. Every effort was made to ensure accuracy; however, inadvertent computerized transcription errors may still be present.        Final Clinical Impression(s) / ED Diagnoses Final diagnoses:  Laceration of right middle finger without foreign body without damage to nail, initial encounter    Rx / DC Orders ED Discharge Orders     None         Mora Bellman 04/18/23 1816    Ernie Avena, MD 04/18/23 2033

## 2023-04-18 NOTE — Discharge Instructions (Signed)
 You have been seen today for your complaint of finger laceration. Your imaging was reassuring. Your discharge medications include Alternate tylenol and ibuprofen for pain. You may alternate these every 4 hours. You may take up to 800 mg of ibuprofen at a time and up to 1000 mg of tylenol. Home care instructions are as follows:  Keep the area clean and dry for 24 hours.  Clean with warm soapy water once daily after this Follow up with: Your primary care provider or urgent care in 7 days for suture removal Please seek immediate medical care if you develop any of the following symptoms: You develop severe swelling around the wound. Your pain suddenly increases and is severe. You develop painful lumps near the wound or on skin anywhere else on your body. You have a red streak going away from your wound. The wound is on your hand or foot, and you cannot properly move a finger or toe. The wound is on your hand or foot, and you notice that your fingers or toes look pale or bluish. At this time there does not appear to be the presence of an emergent medical condition, however there is always the potential for conditions to change. Please read and follow the below instructions.  Do not take your medicine if  develop an itchy rash, swelling in your mouth or lips, or difficulty breathing; call 911 and seek immediate emergency medical attention if this occurs.  You may review your lab tests and imaging results in their entirety on your MyChart account.  Please discuss all results of fully with your primary care provider and other specialist at your follow-up visit.  Note: Portions of this text may have been transcribed using voice recognition software. Every effort was made to ensure accuracy; however, inadvertent computerized transcription errors may still be present.

## 2023-04-18 NOTE — Discharge Instructions (Signed)
 Please go to [facility] for further evaluation and management today.

## 2023-04-18 NOTE — ED Provider Notes (Signed)
 [facility]    CSN: [id number] Arrival date & time: 04/18/23  1047      History   Chief Complaint Chief Complaint  Patient presents with   Laceration    HPI MAJD TISSUE is a 58 y.o. male.   Patient presents with laceration to right finger that occurred today while at work.  Patient reports that his finger got stuck in between heavy machinery.  He has full range of motion of finger.  He is not reporting any numbness or tingling.  Last tetanus vaccination was in 2021.   Laceration   Past Medical History:  Diagnosis Date   Anxiety    takes Xanax as needed   Diverticulosis    History of colonoscopy    History of migraine    takes Maxalt daily as needed   Hyperlipidemia    takes Atorvastatin daily   Hypertension    takes Atenolol daily   Muscle spasm    takes Flexeril daily as needed    Patient Active Problem List   Diagnosis Date Noted   Distal radius fracture, right 06/20/2015    Past Surgical History:  Procedure Laterality Date   CLOSED REDUCTION METACARPAL WITH PERCUTANEOUS PINNING Left 04/17/2018   Procedure: CLOSED REDUCTION LEFT THUMB DISTAL PHALYNX WITH PERCUTANEOUS PINNING AND NAILBED AND SKIN REPAIR;  Surgeon: Betha Loa, MD;  Location: [facility];  Service: Orthopedics;  Laterality: Left;   COLONOSCOPY     HERNIA REPAIR  80's   INCISION AND DRAINAGE WOUND WITH NAILBED REPAIR Left 04/17/2018   Procedure: INCISION AND DRAINAGE WOUND WITH NAILBED REPAIR;  Surgeon: Betha Loa, MD;  Location:  SURGERY CENTER;  Service: Orthopedics;  Laterality: Left;   OPEN REDUCTION INTERNAL FIXATION (ORIF) DISTAL RADIAL FRACTURE Right 06/20/2015   Procedure: OPEN REDUCTION INTERNAL FIXATION (ORIF RIGHT ) DISTAL RADIAL FRACTURE WITH REPAIRS ;  Surgeon: Dominica Severin, MD;  Location: [facility] OR;  Service: Orthopedics;  Laterality: Right;   WISDOM TOOTH EXTRACTION         Home Medications    Prior to Admission medications   Medication  Sig Start Date End Date Taking? Authorizing Provider  Acetaminophen-Caffeine (EXCEDRIN TENSION HEADACHE) 500-65 MG TABS Take 1 tablet by mouth 2 (two) times daily as needed (for headache).     [provider]  atenolol (TENORMIN) 50 MG tablet Take 50 mg by mouth daily. 04/16/15   [provider]  atorvastatin (LIPITOR) 10 MG tablet Take 10 mg by mouth daily. 03/28/15   [provider]  benzonatate (TESSALON) 100 MG capsule Take 1-2 capsules (100-200 mg total) by mouth 3 (three) times daily as needed for cough. 06/07/21   Wallis Bamberg, PA-C  levocetirizine (XYZAL) 5 MG tablet Take 1 tablet (5 mg total) by mouth every evening. 06/07/21   Wallis Bamberg, PA-C  lisinopril (PRINIVIL,ZESTRIL) 10 MG tablet Take 10 mg by mouth daily.    [provider]  metoprolol tartrate (LOPRESSOR) 100 MG tablet Take 100 mg by mouth 2 (two) times daily.    [provider]  Multiple Vitamin (MULTIVITAMIN) tablet Take 1 tablet by mouth daily.    [provider]  naltrexone (DEPADE) 50 MG tablet Take 50 mg by mouth daily.    [provider]  naproxen (NAPROSYN) 500 MG tablet Take 1 tablet (500 mg total) by mouth 2 (two) times daily. 03/10/20   Hall-Potvin, [place], PA-C  promethazine-dextromethorphan (PROMETHAZINE-DM) 6.25-15 MG/5ML syrup Take 5 mLs by mouth at bedtime as needed for  cough. 06/07/21   Wallis Bamberg, PA-C  pseudoephedrine (SUDAFED) 60 MG tablet Take 1 tablet (60 mg total) by mouth every 8 (eight) hours as needed for congestion. 06/07/21   Wallis Bamberg, PA-C  rizatriptan (MAXALT) 10 MG tablet Take 10 mg by mouth as needed for migraine (May repeat in 2 hours, if needed.).  06/15/15   [provider]  traZODone (DESYREL) 100 MG tablet Take 100 mg by mouth at bedtime.    [provider]  venlafaxine (EFFEXOR) 25 MG tablet Take 25 mg by mouth 2 (two) times daily.    [provider]    Family History Family History  Problem Relation Age of  Onset   Healthy Mother    Healthy Father     Social History Social History   Tobacco Use   Smoking status: Never   Smokeless tobacco: Current    Types: Snuff  Vaping Use   Vaping status: Never Used  Substance Use Topics   Alcohol use: Yes    Alcohol/week: 5.0 standard drinks of alcohol    Types: 5 Cans of beer per week    Comment: hx of ETOH abuse   Drug use: No     Allergies   Patient has no known allergies.   Review of Systems Review of Systems Per HPI  Physical Exam Triage Vital Signs ED Triage Vitals  Encounter Vitals Group     BP 04/18/23 1301 111/72     Systolic BP Percentile --      Diastolic BP Percentile --      Pulse Rate 04/18/23 1301 71     Resp 04/18/23 1301 18     Temp 04/18/23 1301 98.3 F (36.8 C)     Temp src --      SpO2 04/18/23 1301 94 %     Weight 04/18/23 1259 162 lb 11.2 oz (73.8 kg)     Height --      Head Circumference --      Peak Flow --      Pain Score 04/18/23 1259 6     Pain Loc --      Pain Education --      Exclude from Growth Chart --    No data found.  Updated Vital Signs BP 111/72 (BP Location: Left Arm)   Pulse 71   Temp 98.3 F (36.8 C)   Resp 18   Wt 162 lb 11.2 oz (73.8 kg)   SpO2 94%   BMI 22.07 kg/m   Visual Acuity Right Eye Distance:   Left Eye Distance:   Bilateral Distance:    Right Eye Near:   Left Eye Near:    Bilateral Near:     Physical Exam Constitutional:      General: He is not in acute distress.    Appearance: Normal appearance. He is not toxic-appearing or diaphoretic.  HENT:     Head: Normocephalic and atraumatic.  Eyes:     Extraocular Movements: Extraocular movements intact.     Conjunctiva/sclera: Conjunctivae normal.  Pulmonary:     Effort: Pulmonary effort is normal.  Skin:    Comments: Patient has jagged, irregularly shaped laceration present to proximal portion of right third digit.  Bleeding controlled.  Full range of motion of finger present.  Significant swelling  noted at laceration site.  Capillary refill and pulses are intact.  Neurological:     General: No focal deficit present.     Mental Status: He is alert and oriented to  person, place, and time. Mental status is at baseline.  Psychiatric:        Mood and Affect: Mood normal.        Behavior: Behavior normal.        Thought Content: Thought content normal.        Judgment: Judgment normal.      UC Treatments / Results  Labs (all labs ordered are listed, but only abnormal results are displayed) Labs Reviewed - No data to display  EKG   Radiology No results found.  Procedures Procedures (including critical care time)  Medications Ordered in UC Medications - No data to display  Initial Impression / Assessment and Plan / UC Course  I have reviewed the triage vital signs and the nursing notes.  Pertinent labs & imaging results that were available during my care of the patient were reviewed by me and considered in my medical decision making (see chart for details).     Given significance of laceration on physical exam, recommended the patient be evaluated and managed at the emergency department.  Patient was agreeable with this plan.  Patient left via self transport to go to the emergency department. Final Clinical Impressions(s) / UC Diagnoses   Final diagnoses:  Laceration of right middle finger without foreign body without damage to nail, initial encounter     Discharge Instructions      Please go to [facility] for further evaluation and management today.    ED Prescriptions   None    PDMP not reviewed this encounter.   Gustavus Bryant, [place] 04/18/23 [phone]

## 2023-04-18 NOTE — ED Triage Notes (Signed)
 Lac on right hand- middle digit. Pinched in fork-lift.

## 2023-04-18 NOTE — ED Notes (Signed)
 Patient is being discharged from the Urgent Care and sent to the Emergency Department via personal vehicle . Per Ervin Knack NP, patient is in need of higher level of care due to right hand laceration. Patient is aware and verbalizes understanding of plan of care.  Vitals:   04/18/23 1301  BP: 111/72  Pulse: 71  Resp: 18  Temp: 98.3 F (36.8 C)  SpO2: 94%

## 2023-04-19 ENCOUNTER — Telehealth (HOSPITAL_BASED_OUTPATIENT_CLINIC_OR_DEPARTMENT_OTHER): Payer: Self-pay | Admitting: Emergency Medicine

## 2023-04-19 NOTE — Telephone Encounter (Cosign Needed)
 Patient called in to the ED asking for pain medication after [employer] lift injury to finger.  I reviewed the PDMP. It appears the patient received Buprenorphine-naloxone on 04/13/2023. I have offered to call in non-narcotic pain control. Patient will take tylenol and ibuprofen

## 2023-04-25 ENCOUNTER — Ambulatory Visit
Admission: RE | Admit: 2023-04-25 | Discharge: 2023-04-25 | Discharge status: Home / Self Care | Source: Ambulatory Visit

## 2023-04-25 ENCOUNTER — Encounter: Payer: Self-pay | Admitting: Emergency Medicine

## 2023-04-25 ENCOUNTER — Ambulatory Visit
Admission: RE | Admit: 2023-04-25 | Discharge: 2023-04-25 | Disposition: A | Discharge status: Home / Self Care | Source: Ambulatory Visit

## 2023-04-25 ENCOUNTER — Other Ambulatory Visit: Payer: Self-pay

## 2023-04-25 DIAGNOSIS — T148XXA Other injury of unspecified body region, initial encounter: Secondary | ICD-10-CM

## 2023-04-25 DIAGNOSIS — Z4802 Encounter for removal of sutures: Secondary | ICD-10-CM

## 2023-04-25 DIAGNOSIS — L089 Local infection of the skin and subcutaneous tissue, unspecified: Secondary | ICD-10-CM

## 2023-04-25 MED ORDER — SULFAMETHOXAZOLE-TRIMETHOPRIM 800-160 MG PO TABS: 1.0000 | ORAL_TABLET | Freq: Two times a day (BID) | ORAL | 0 refills | Status: AC

## 2023-04-25 NOTE — Discharge Instructions (Addendum)
 Ibuprofen and Tylenol by alternating both medications to help with pain.  Keep wound bandaged until wound completely heals.  Complete entire course of antibiotics.  Swelling should subside as the infection resolves.

## 2023-04-25 NOTE — ED Provider Notes (Addendum)
 [facility]    CSN: [id number] Arrival date & time: 04/25/23  0949      History   Chief Complaint Chief Complaint  Patient presents with   Suture / Staple Removal    HPI Gregory Walsh is a 58 y.o. male.    Suture / Staple Removal  Patient here today for evaluation of laceration involving the right middle finger at the base.  Patient was seen at the emergency department and sutured with 5 suturing material.  Endorses significant pain that has not improved over the last few days and a large amount of swelling at the site of the wound.  He has not seen any exudate or pus drainage.  He denies reinjuring the wound. Past Medical History:  Diagnosis Date   Anxiety    takes Xanax as needed   Diverticulosis    History of colonoscopy    History of migraine    takes Maxalt daily as needed   Hyperlipidemia    takes Atorvastatin daily   Hypertension    takes Atenolol daily   Muscle spasm    takes Flexeril daily as needed    Patient Active Problem List   Diagnosis Date Noted   Laceration of left thumb without foreign body with damage to nail 04/17/2018   Closed displaced fracture of distal phalanx of left thumb 04/17/2018   Distal radius fracture, right 06/20/2015    Past Surgical History:  Procedure Laterality Date   CLOSED REDUCTION METACARPAL WITH PERCUTANEOUS PINNING Left 04/17/2018   Procedure: CLOSED REDUCTION LEFT THUMB DISTAL PHALYNX WITH PERCUTANEOUS PINNING AND NAILBED AND SKIN REPAIR;  Surgeon: Betha Loa, MD;  Location: [facility];  Service: Orthopedics;  Laterality: Left;   COLONOSCOPY     HERNIA REPAIR  80's   INCISION AND DRAINAGE WOUND WITH NAILBED REPAIR Left 04/17/2018   Procedure: INCISION AND DRAINAGE WOUND WITH NAILBED REPAIR;  Surgeon: Betha Loa, MD;  Location: [facility];  Service: Orthopedics;  Laterality: Left;   OPEN REDUCTION INTERNAL FIXATION (ORIF) DISTAL RADIAL FRACTURE Right 06/20/2015   Procedure: OPEN  REDUCTION INTERNAL FIXATION (ORIF RIGHT ) DISTAL RADIAL FRACTURE WITH REPAIRS ;  Surgeon: Dominica Severin, MD;  Location: [facility];  Service: Orthopedics;  Laterality: Right;   WISDOM TOOTH EXTRACTION         Home Medications    Prior to Admission medications   Medication Sig Start Date End Date Taking? Authorizing Provider  Buprenorphine HCl-Naloxone HCl 8-2 MG FILM Place under the tongue 2 (two) times daily. 04/13/23  Yes [provider]  busPIRone (BUSPAR) 10 MG tablet Take 10 mg by mouth as directed. 03/20/18  Yes [provider]  escitalopram (LEXAPRO) 20 MG tablet Take 20 mg by mouth daily. 02/08/18  Yes [provider]  gabapentin (NEURONTIN) 600 MG tablet Take 600 mg by mouth 3 (three) times daily. 04/24/23  Yes [provider]  metoprolol succinate (TOPROL-XL) 50 MG 24 hr tablet Take 50 mg by mouth daily. 02/07/18  Yes [provider]  mirtazapine (REMERON) 30 MG tablet Take 30 mg by mouth at bedtime. 03/22/23  Yes [provider]  oxyCODONE (OXY IR/ROXICODONE) 5 MG immediate release tablet Take by mouth every 6 (six) hours as needed. 05/08/18  Yes [provider]  traMADol (ULTRAM) 50 MG tablet Take 50 mg by mouth every 6 (six) hours as needed. 05/18/18  Yes [provider]  Acetaminophen-Caffeine (EXCEDRIN TENSION HEADACHE) 500-65 MG TABS Take 1 tablet by mouth  2 (two) times daily as needed (for headache).     [provider]  atenolol (TENORMIN) 50 MG tablet Take 50 mg by mouth daily. 04/16/15   [provider]  atorvastatin (LIPITOR) 10 MG tablet Take 10 mg by mouth daily. 03/28/15   [provider]  benzonatate (TESSALON) 100 MG capsule Take 1-2 capsules (100-200 mg total) by mouth 3 (three) times daily as needed for cough. 06/07/21   Wallis Bamberg, PA-C  levocetirizine (XYZAL) 5 MG tablet Take 1 tablet (5 mg total) by mouth every evening. 06/07/21   Wallis Bamberg, PA-C  lisinopril (PRINIVIL,ZESTRIL) 10  MG tablet Take 10 mg by mouth daily.    [provider]  metoprolol tartrate (LOPRESSOR) 100 MG tablet Take 100 mg by mouth 2 (two) times daily.    [provider]  Multiple Vitamin (MULTIVITAMIN) tablet Take 1 tablet by mouth daily.    [provider]  naltrexone (DEPADE) 50 MG tablet Take 50 mg by mouth daily.    [provider]  naproxen (NAPROSYN) 500 MG tablet Take 1 tablet (500 mg total) by mouth 2 (two) times daily. 03/10/20   Hall-Potvin, [place], PA-C  promethazine-dextromethorphan (PROMETHAZINE-DM) 6.25-15 MG/5ML syrup Take 5 mLs by mouth at bedtime as needed for cough. 06/07/21   Wallis Bamberg, PA-C  pseudoephedrine (SUDAFED) 60 MG tablet Take 1 tablet (60 mg total) by mouth every 8 (eight) hours as needed for congestion. 06/07/21   Wallis Bamberg, PA-C  rizatriptan (MAXALT) 10 MG tablet Take 10 mg by mouth as needed for migraine (May repeat in 2 hours, if needed.).  06/15/15   [provider]  traZODone (DESYREL) 100 MG tablet Take 100 mg by mouth at bedtime.    [provider]  venlafaxine (EFFEXOR) 25 MG tablet Take 25 mg by mouth 2 (two) times daily.    [provider]    Family History Family History  Problem Relation Age of Onset   Healthy Mother    Healthy Father     Social History Social History   Tobacco Use   Smoking status: Never   Smokeless tobacco: Current    Types: Snuff  Vaping Use   Vaping status: Never Used  Substance Use Topics   Alcohol use: Yes    Alcohol/week: 5.0 standard drinks of alcohol    Types: 5 Cans of beer per week    Comment: hx of ETOH abuse   Drug use: No     Allergies   Patient has no known allergies.   Review of Systems Review of Systems Left finger swelling and pain  Physical Exam Triage Vital Signs ED Triage Vitals [04/25/23 1024]  Encounter Vitals Group     BP 134/89     Systolic BP Percentile      Diastolic BP Percentile      Pulse Rate 85     Resp 18     Temp  98.5 F (36.9 C)     Temp Source Oral     SpO2 97 %     Weight      Height      Head Circumference      Peak Flow      Pain Score 5     Pain Loc      Pain Education      Exclude from Growth Chart    No data found.  Updated Vital Signs BP 134/89 (BP Location: Left Arm)   Pulse 85   Temp 98.5 F (36.9  C) (Oral)   Resp 18   SpO2 97%   Visual Acuity Right Eye Distance:   Left Eye Distance:   Bilateral Distance:    Right Eye Near:   Left Eye Near:    Bilateral Near:     Physical Exam Vitals reviewed.  Constitutional:      Appearance: Normal appearance.  HENT:     Head: Normocephalic and atraumatic.  Eyes:     Extraocular Movements: Extraocular movements intact.     Pupils: Pupils are equal, round, and reactive to light.  Cardiovascular:     Rate and Rhythm: Normal rate and regular rhythm.  Pulmonary:     Effort: Pulmonary effort is normal.     Breath sounds: Normal breath sounds.  Skin:    General: Skin is warm and dry.     Capillary Refill: Capillary refill takes less than 2 seconds.       Neurological:     General: No focal deficit present.     Mental Status: He is alert and oriented to person, place, and time.      UC Treatments / Results  Labs (all labs ordered are listed, but only abnormal results are displayed) Labs Reviewed - No data to display  EKG   Radiology No results found.  Procedures Procedures (including critical care time)  Medications Ordered in UC Medications - No data to display  Initial Impression / Assessment and Plan / UC Course  I have reviewed the triage vital signs and the nursing notes.  Pertinent labs & imaging results that were available during my care of the patient were reviewed by me and considered in my medical decision making (see chart for details).    RN removed 5 suture material.  Treating for infected laceration with Bactrim twice daily for 10 days.  Patient advised to return if swelling and pain does not  subside after completing antibiotics.  Final Clinical Impressions(s) / UC Diagnoses   Final diagnoses:  Visit for suture removal  Infected laceration     Discharge Instructions      Ibuprofen and Tylenol by alternating both medications to help with pain.  Keep wound bandaged until wound completely heals.  Complete entire course of antibiotics.  Swelling should subside as the infection resolves.     ED Prescriptions   None    PDMP not reviewed this encounter.   Bing Neighbors, NP 04/25/23 1058    Bing Neighbors, NP 04/25/23 1101

## 2023-04-25 NOTE — ED Triage Notes (Signed)
 Pt here for suture removal to right middle digit; wound healed and 5 sutures present

## 2023-08-25 ENCOUNTER — Encounter: Payer: Self-pay | Admitting: Podiatrist

## 2023-08-25 ENCOUNTER — Ambulatory Visit: Admitting: Podiatry

## 2023-08-25 ENCOUNTER — Ambulatory Visit (INDEPENDENT_AMBULATORY_CARE_PROVIDER_SITE_OTHER)

## 2023-08-25 ENCOUNTER — Ambulatory Visit (INDEPENDENT_AMBULATORY_CARE_PROVIDER_SITE_OTHER): Admitting: Podiatrist

## 2023-08-25 DIAGNOSIS — M79671 Pain in right foot: Secondary | ICD-10-CM | POA: Diagnosis not present

## 2023-08-25 DIAGNOSIS — M79672 Pain in left foot: Secondary | ICD-10-CM | POA: Diagnosis not present

## 2023-08-25 DIAGNOSIS — R52 Pain, unspecified: Secondary | ICD-10-CM

## 2023-08-25 DIAGNOSIS — M722 Plantar fascial fibromatosis: Secondary | ICD-10-CM | POA: Diagnosis not present

## 2023-08-25 MED ORDER — MELOXICAM 15 MG PO TABS: 15.0000 mg | ORAL_TABLET | Freq: Every day | ORAL | 2 refills | Status: AC

## 2023-08-25 MED ORDER — PREDNISONE 10 MG (21) PO TBPK: ORAL_TABLET | ORAL | 0 refills | Status: AC

## 2023-08-25 NOTE — Patient Instructions (Signed)

## 2023-08-25 NOTE — Progress Notes (Unsigned)
 Subjective: Gregory Walsh a 58 y.o. male patient presents to office with complaint of moderate heel pain on the right greater than left  heel. Patient admits to post static dyskinesia for 3-4 months in duration. Denies any other pedal complaints.   Patient Active Problem List   Diagnosis Date Noted   Laceration of left thumb without foreign body with damage to nail 04/17/2018   Closed displaced fracture of distal phalanx of left thumb 04/17/2018   Distal radius fracture, right 06/20/2015    Current Outpatient Medications on File Prior to Visit  Medication Sig Dispense Refill   Acetaminophen -Caffeine (EXCEDRIN TENSION HEADACHE) 500-65 MG TABS Take 1 tablet by mouth 2 (two) times daily as needed (for headache).      atenolol  (TENORMIN ) 50 MG tablet Take 50 mg by mouth daily.  2   atorvastatin  (LIPITOR) 10 MG tablet Take 10 mg by mouth daily.  6   benzonatate  (TESSALON ) 100 MG capsule Take 1-2 capsules (100-200 mg total) by mouth 3 (three) times daily as needed for cough. 60 capsule 0   Buprenorphine HCl-Naloxone HCl 8-2 MG FILM Place under the tongue 2 (two) times daily.     busPIRone (BUSPAR) 10 MG tablet Take 10 mg by mouth as directed.     escitalopram (LEXAPRO) 20 MG tablet Take 20 mg by mouth daily.     gabapentin (NEURONTIN) 600 MG tablet Take 600 mg by mouth 3 (three) times daily.     levocetirizine (XYZAL ) 5 MG tablet Take 1 tablet (5 mg total) by mouth every evening. 30 tablet 0   lisinopril (PRINIVIL,ZESTRIL) 10 MG tablet Take 10 mg by mouth daily.     metoprolol succinate (TOPROL-XL) 50 MG 24 hr tablet Take 50 mg by mouth daily.     metoprolol tartrate (LOPRESSOR) 100 MG tablet Take 100 mg by mouth 2 (two) times daily.     mirtazapine (REMERON) 30 MG tablet Take 30 mg by mouth at bedtime.     Multiple Vitamin (MULTIVITAMIN) tablet Take 1 tablet by mouth daily.     naltrexone (DEPADE) 50 MG tablet Take 50 mg by mouth daily.     naproxen  (NAPROSYN ) 500 MG tablet Take 1 tablet (500 mg  total) by mouth 2 (two) times daily. 30 tablet 0   oxyCODONE  (OXY IR/ROXICODONE ) 5 MG immediate release tablet Take by mouth every 6 (six) hours as needed.     promethazine -dextromethorphan (PROMETHAZINE -DM) 6.25-15 MG/5ML syrup Take 5 mLs by mouth at bedtime as needed for cough. 100 mL 0   pseudoephedrine  (SUDAFED) 60 MG tablet Take 1 tablet (60 mg total) by mouth every 8 (eight) hours as needed for congestion. 30 tablet 0   rizatriptan (MAXALT) 10 MG tablet Take 10 mg by mouth as needed for migraine (May repeat in 2 hours, if needed.).      sulfamethoxazole -trimethoprim  (BACTRIM  DS) 800-160 MG tablet Take 1 tablet by mouth 2 (two) times daily. 20 tablet 0   traMADol (ULTRAM) 50 MG tablet Take 50 mg by mouth every 6 (six) hours as needed.     traZODone (DESYREL) 100 MG tablet Take 100 mg by mouth at bedtime.     venlafaxine (EFFEXOR) 25 MG tablet Take 25 mg by mouth 2 (two) times daily.     No current facility-administered medications on file prior to visit.    No Known Allergies  Objective: Physical Exam General: The patient is alert and oriented x3 in no acute distress.  Dermatology: Skin is warm, dry and supple bilateral lower  extremities. Nails 1-10 are normal. There is no erythema, edema, no eccymosis, no open lesions present. Integument is otherwise unremarkable.  Vascular: Dorsalis Pedis pulse and Posterior Tibial pulse are 2/4 bilateral. Capillary fill time is immediate to all digits.  Neurological: Grossly intact to light touch with an achilles reflex of +2/5 and a  negative Tinel'Walsh sign bilateral.  Musculoskeletal: Tenderness to palpation at the medial calcaneal tubercale and through the insertion of the plantar fascia on the Bilateral foot. No pain with compression of calcaneus bilateral. No pain with tuning fork to calcaneus bilateral. No pain with calf compression bilateral. There is decreased Ankle joint range of motion bilateral. All other joints range of motion within  normal limits bilateral. Strength 5/5 in all groups bilateral.   Xray, bilateral foot:  Normal osseous mineralization. Joint spaces preserved. No fracture/dislocation/boney destruction. small calcaneal spur present with mild thickening of plantar fascia. No other soft tissue abnormalities or radiopaque foreign bodies.   Assessment and Plan: Problem List Items Addressed This Visit   None Visit Diagnoses       Plantar fasciitis of right foot    -  Primary   Relevant Orders   DG Foot Complete Right   DG Foot Complete Left       -Complete examination performed.  -Xrays reviewed - Discussed etiology and pathology of plantar fasciitis along with conservative approach to treatment.   -After oral consent and aseptic prep, injected a mixture containing 10mg  Kenalog and 5mg  marcaine  plain was infiltrated into the area of maximal tenderness of the Bilateral heel. Post-injection care discussed with patient.  -Rx Meloxicam  to start for 2 weeks and taper was advised after 2 weeks.  -Recommended good supportive shoes and advised use of OTC insert- he would like to get an orthotic on his own- discussed shoe gear recommendation.  -Explained and dispensed to patient daily stretching exercises.. -Patient to return to office in 4-6 weeks if symptoms worsen or fail to improve.

## 2023-08-28 ENCOUNTER — Encounter: Payer: Self-pay | Admitting: Podiatrist

## 2023-10-04 ENCOUNTER — Ambulatory Visit: Admitting: Podiatry

## 2023-10-17 ENCOUNTER — Ambulatory Visit (INDEPENDENT_AMBULATORY_CARE_PROVIDER_SITE_OTHER): Admitting: Podiatry

## 2023-10-17 DIAGNOSIS — Z91199 Patient's noncompliance with other medical treatment and regimen due to unspecified reason: Secondary | ICD-10-CM

## 2023-10-17 NOTE — Progress Notes (Signed)
 Cancel 24 hours

## 2023-11-30 ENCOUNTER — Ambulatory Visit (INDEPENDENT_AMBULATORY_CARE_PROVIDER_SITE_OTHER)

## 2023-11-30 DIAGNOSIS — M722 Plantar fascial fibromatosis: Secondary | ICD-10-CM | POA: Diagnosis not present

## 2023-11-30 MED ORDER — TRIAMCINOLONE ACETONIDE 10 MG/ML IJ SUSP
10.0000 mg | Freq: Once | INTRAMUSCULAR | Status: AC
  Administered 2023-11-30: 10 mg via INTRAMUSCULAR

## 2023-11-30 NOTE — Progress Notes (Signed)
 Subjective: Gregory Walsh s a 58 y.o. male patient presents to office with complaint of moderate heel pain on the right greater than left  heel. He had a cortisone injection bilaterally 08/25/23 that provided relief for approximately 2.5 months. He states the pain has returned and is the same as his previously diagnosed plantar fasciitis. Denies any other pedal complaints.   Patient Active Problem List   Diagnosis Date Noted   Laceration of left thumb without foreign body with damage to nail 04/17/2018   Closed displaced fracture of distal phalanx of left thumb 04/17/2018   Distal radius fracture, right 06/20/2015    Current Outpatient Medications on File Prior to Visit  Medication Sig Dispense Refill   Acetaminophen -Caffeine (EXCEDRIN TENSION HEADACHE) 500-65 MG TABS Take 1 tablet by mouth 2 (two) times daily as needed (for headache).      atenolol  (TENORMIN ) 50 MG tablet Take 50 mg by mouth daily.  2   atorvastatin  (LIPITOR) 10 MG tablet Take 10 mg by mouth daily.  6   benzonatate  (TESSALON ) 100 MG capsule Take 1-2 capsules (100-200 mg total) by mouth 3 (three) times daily as needed for cough. 60 capsule 0   Buprenorphine HCl-Naloxone HCl 8-2 MG FILM Place under the tongue 2 (two) times daily.     busPIRone (BUSPAR) 10 MG tablet Take 10 mg by mouth as directed.     escitalopram (LEXAPRO) 20 MG tablet Take 20 mg by mouth daily.     gabapentin (NEURONTIN) 600 MG tablet Take 600 mg by mouth 3 (three) times daily.     levocetirizine (XYZAL ) 5 MG tablet Take 1 tablet (5 mg total) by mouth every evening. 30 tablet 0   lisinopril (PRINIVIL,ZESTRIL) 10 MG tablet Take 10 mg by mouth daily.     meloxicam  (MOBIC ) 15 MG tablet Take 1 tablet (15 mg total) by mouth daily. Pause this medication while taking the sterapred uni pak. 30 tablet 2   metoprolol succinate (TOPROL-XL) 50 MG 24 hr tablet Take 50 mg by mouth daily.     metoprolol tartrate (LOPRESSOR) 100 MG tablet Take 100 mg by mouth 2 (two) times daily.      mirtazapine (REMERON) 30 MG tablet Take 30 mg by mouth at bedtime.     Multiple Vitamin (MULTIVITAMIN) tablet Take 1 tablet by mouth daily.     naltrexone (DEPADE) 50 MG tablet Take 50 mg by mouth daily.     naproxen  (NAPROSYN ) 500 MG tablet Take 1 tablet (500 mg total) by mouth 2 (two) times daily. 30 tablet 0   oxyCODONE  (OXY IR/ROXICODONE ) 5 MG immediate release tablet Take by mouth every 6 (six) hours as needed.     predniSONE  (STERAPRED UNI-PAK 21 TAB) 10 MG (21) TBPK tablet Take pack as per package instructions.  Take in 2-3 weeks if foot still painful after injection 21 tablet 0   promethazine -dextromethorphan (PROMETHAZINE -DM) 6.25-15 MG/5ML syrup Take 5 mLs by mouth at bedtime as needed for cough. 100 mL 0   pseudoephedrine  (SUDAFED) 60 MG tablet Take 1 tablet (60 mg total) by mouth every 8 (eight) hours as needed for congestion. 30 tablet 0   rizatriptan (MAXALT) 10 MG tablet Take 10 mg by mouth as needed for migraine (May repeat in 2 hours, if needed.).      sulfamethoxazole -trimethoprim  (BACTRIM  DS) 800-160 MG tablet Take 1 tablet by mouth 2 (two) times daily. 20 tablet 0   traMADol (ULTRAM) 50 MG tablet Take 50 mg by mouth every 6 (six) hours as needed.  traZODone (DESYREL) 100 MG tablet Take 100 mg by mouth at bedtime.     venlafaxine (EFFEXOR) 25 MG tablet Take 25 mg by mouth 2 (two) times daily.     No current facility-administered medications on file prior to visit.    No Known Allergies  Objective: Physical Exam General: The patient is alert and oriented x3 in no acute distress.  Dermatology: Skin is warm, dry and supple bilateral lower extremities. Nails 2-5 b/l are normal.  Bilateral hallux nails were avulsed by patient.  Healthy, healing wound bed present without signs of infection.  There is no erythema, edema, no eccymosis, no open lesions present. Integument is otherwise unremarkable.  Vascular: Dorsalis Pedis pulse and Posterior Tibial pulse are 2/4 bilateral.  Capillary fill time is immediate to all digits.  Neurological: Grossly intact to light touch with an achilles reflex of +2/5 and a  negative Tinel's sign bilateral.  Musculoskeletal: Tenderness to palpation at the medial calcaneal tubercale and through the insertion of the plantar fascia on the Bilateral foot. No pain with compression of calcaneus bilateral. No pain with tuning fork to calcaneus bilateral. No pain with calf compression bilateral. There is decreased Ankle joint range of motion bilateral. All other joints range of motion within normal limits bilateral.  No pain to palpation of posterior tibial tendon.  Strength 5/5 in all groups bilateral.   Xray, bilateral foot taken previously:  Normal osseous mineralization. Joint spaces preserved. No fracture/dislocation/boney destruction. small calcaneal spur present with mild thickening of plantar fascia. No other soft tissue abnormalities or radiopaque foreign bodies.  Mild pes planus foot deformity bilaterally.  Assessment and Plan: 1. Plantar fasciitis of right foot   2. Plantar fasciitis of left foot      -Complete examination performed.  -Xrays reviewed - Discussed etiology and pathology of plantar fasciitis along with conservative approach to treatment.  Discussed importance of stretching in preventing recurrence of plantar fasciitis.  -It has been 3 months since his last injection, he would like bilateral cortisone injections today, performed as described below.   -He has not acquired good OTC insoles as discussed at last visit. We discussed the importance of good insoles again today.  -Explained and dispensed to patient daily stretching exercises.. -Patient to return to office in 4-6 weeks if symptoms worsen or fail to improve.  - Patient avulsed his own toenails at home, he states that this is a nervous habit.  No signs of infection or complication.  He is going to continue putting antibiotic ointment and Band-Aid on them daily as  he has previously.  Procedure: Injection tendon/ligament bilaterally Location: Bilateral plantar fascia origin at medial calcaneal tubercle Skin Prep: alcohol  Injectate: 0.5 cc 0.5% marcaine  plain, 0.5 cc of 1% Lidocaine , 0.5 cc kenalog 10. Disposition: Patient tolerated procedure well. Injection site dressed with a band-aid.

## 2023-11-30 NOTE — Patient Instructions (Signed)

## 2023-12-01 ENCOUNTER — Telehealth: Payer: Self-pay | Admitting: Lab

## 2023-12-01 NOTE — Telephone Encounter (Signed)
 Patient states had two injections left foot doing well right foot is extreme pain as if nothing had been done to it please advise.

## 2023-12-04 NOTE — Telephone Encounter (Signed)
 Spoke to patient and advised.
